# Patient Record
Sex: Male | Born: 1955 | Race: White | Hispanic: No | Marital: Single | Smoking: Current every day smoker
Health system: Southern US, Community
[De-identification: ages and names within clinical notes are randomized; demographics above are authoritative.]

## PROBLEM LIST (undated history)

## (undated) DIAGNOSIS — F1010 Alcohol abuse, uncomplicated: Secondary | ICD-10-CM

## (undated) DIAGNOSIS — K4090 Unilateral inguinal hernia, without obstruction or gangrene, not specified as recurrent: Secondary | ICD-10-CM

## (undated) HISTORY — PX: OTHER SURGICAL HISTORY: SHX169

## (undated) HISTORY — PX: ABDOMINAL SURGERY: SHX537

---

## 2008-10-02 ENCOUNTER — Emergency Department (HOSPITAL_COMMUNITY): Admission: EM | Admit: 2008-10-02 | Discharge: 2008-10-03 | Payer: Self-pay | Admitting: Emergency Medicine

## 2011-06-20 ENCOUNTER — Emergency Department (HOSPITAL_COMMUNITY): Payer: Self-pay

## 2011-06-20 ENCOUNTER — Inpatient Hospital Stay (HOSPITAL_COMMUNITY)
Admission: EM | Admit: 2011-06-20 | Discharge: 2011-06-23 | DRG: 917 | Discharge status: Against Medical Advice | Payer: Self-pay | Attending: Surgery | Admitting: Surgery

## 2011-06-20 DIAGNOSIS — T65891A Toxic effect of other specified substances, accidental (unintentional), initial encounter: Secondary | ICD-10-CM | POA: Diagnosis present

## 2011-06-20 DIAGNOSIS — F1410 Cocaine abuse, uncomplicated: Secondary | ICD-10-CM | POA: Diagnosis present

## 2011-06-20 DIAGNOSIS — Z5900 Homelessness unspecified: Secondary | ICD-10-CM

## 2011-06-20 DIAGNOSIS — F1010 Alcohol abuse, uncomplicated: Secondary | ICD-10-CM | POA: Diagnosis present

## 2011-06-20 DIAGNOSIS — T405X1A Poisoning by cocaine, accidental (unintentional), initial encounter: Secondary | ICD-10-CM | POA: Diagnosis present

## 2011-06-20 DIAGNOSIS — T510X4A Toxic effect of ethanol, undetermined, initial encounter: Principal | ICD-10-CM | POA: Diagnosis present

## 2011-06-20 DIAGNOSIS — J690 Pneumonitis due to inhalation of food and vomit: Secondary | ICD-10-CM | POA: Diagnosis present

## 2011-06-20 DIAGNOSIS — J9600 Acute respiratory failure, unspecified whether with hypoxia or hypercapnia: Secondary | ICD-10-CM | POA: Diagnosis present

## 2011-06-20 DIAGNOSIS — G9340 Encephalopathy, unspecified: Secondary | ICD-10-CM | POA: Diagnosis present

## 2011-06-20 DIAGNOSIS — F17200 Nicotine dependence, unspecified, uncomplicated: Secondary | ICD-10-CM | POA: Diagnosis present

## 2011-06-20 LAB — BLOOD GAS, ARTERIAL
Acid-base deficit: 2.9 mmol/L — ABNORMAL HIGH (ref 0.0–2.0)
FIO2: 1 %
MECHVT: 500 mL
TCO2: 20.6 mmol/L (ref 0–100)
pCO2 arterial: 46.2 mmHg — ABNORMAL HIGH (ref 35.0–45.0)

## 2011-06-20 LAB — DIFFERENTIAL
Basophils Absolute: 0 10*3/uL (ref 0.0–0.1)
Basophils Relative: 0 % (ref 0–1)
Eosinophils Absolute: 0.2 10*3/uL (ref 0.0–0.7)
Eosinophils Relative: 2 % (ref 0–5)

## 2011-06-20 LAB — URINALYSIS, ROUTINE W REFLEX MICROSCOPIC
Glucose, UA: NEGATIVE mg/dL
Ketones, ur: NEGATIVE mg/dL
Leukocytes, UA: NEGATIVE
Protein, ur: NEGATIVE mg/dL
Urobilinogen, UA: 0.2 mg/dL (ref 0.0–1.0)

## 2011-06-20 LAB — COMPREHENSIVE METABOLIC PANEL
AST: 28 U/L (ref 0–37)
Albumin: 3.8 g/dL (ref 3.5–5.2)
CO2: 24 mEq/L (ref 19–32)
Calcium: 8.5 mg/dL (ref 8.4–10.5)
Creatinine, Ser: 0.66 mg/dL (ref 0.50–1.35)
GFR calc non Af Amer: >60 mL/min (ref >60)

## 2011-06-20 LAB — CBC
Platelets: 307 10*3/uL (ref 150–400)
RDW: 12.6 % (ref 11.5–15.5)
WBC: 11.2 10*3/uL — ABNORMAL HIGH (ref 4.0–10.5)

## 2011-06-20 LAB — POCT I-STAT TROPONIN I: Troponin i, poc: 0 ng/mL (ref 0.00–0.08)

## 2011-06-20 LAB — AMMONIA: Ammonia: 76 umol/L — ABNORMAL HIGH (ref 11–60)

## 2011-06-20 LAB — POCT I-STAT, CHEM 8
BUN: 12 mg/dL (ref 6–23)
Calcium, Ion: 1.1 mmol/L — ABNORMAL LOW (ref 1.12–1.32)
Creatinine, Ser: 1.2 mg/dL (ref 0.50–1.35)
TCO2: 24 mmol/L (ref 0–100)

## 2011-06-20 LAB — RAPID URINE DRUG SCREEN, HOSP PERFORMED
Barbiturates: NOT DETECTED
Tetrahydrocannabinol: NOT DETECTED

## 2011-06-20 LAB — SALICYLATE LEVEL: Salicylate Lvl: <2 mg/dL — ABNORMAL LOW (ref 2.8–20.0)

## 2011-06-20 LAB — ACETAMINOPHEN LEVEL: Acetaminophen (Tylenol), Serum: <15 ug/mL (ref 10–30)

## 2011-06-21 ENCOUNTER — Inpatient Hospital Stay (HOSPITAL_COMMUNITY): Payer: Self-pay

## 2011-06-21 DIAGNOSIS — J9600 Acute respiratory failure, unspecified whether with hypoxia or hypercapnia: Secondary | ICD-10-CM

## 2011-06-21 DIAGNOSIS — J189 Pneumonia, unspecified organism: Secondary | ICD-10-CM

## 2011-06-21 DIAGNOSIS — F1410 Cocaine abuse, uncomplicated: Secondary | ICD-10-CM

## 2011-06-21 DIAGNOSIS — F1010 Alcohol abuse, uncomplicated: Secondary | ICD-10-CM

## 2011-06-21 DIAGNOSIS — R4182 Altered mental status, unspecified: Secondary | ICD-10-CM

## 2011-06-21 LAB — BLOOD GAS, ARTERIAL
Acid-base deficit: 4.4 mmol/L — ABNORMAL HIGH (ref 0.0–2.0)
Drawn by: 340271
FIO2: 0.5 %
MECHVT: 500 mL
RATE: 18 resp/min
TCO2: 20.1 mmol/L (ref 0–100)
pCO2 arterial: 48.7 mmHg — ABNORMAL HIGH (ref 35.0–45.0)
pH, Arterial: 7.281 — ABNORMAL LOW (ref 7.350–7.450)
pO2, Arterial: 206 mmHg — ABNORMAL HIGH (ref 80.0–100.0)

## 2011-06-21 LAB — CARDIAC PANEL(CRET KIN+CKTOT+MB+TROPI)
CK, MB: 4.1 ng/mL — ABNORMAL HIGH (ref 0.3–4.0)
Total CK: 153 U/L (ref 7–232)

## 2011-06-21 LAB — BASIC METABOLIC PANEL
BUN: 10 mg/dL (ref 6–23)
CO2: 22 mEq/L (ref 19–32)
Calcium: 7.9 mg/dL — ABNORMAL LOW (ref 8.4–10.5)
Chloride: 110 mEq/L (ref 96–112)
Creatinine, Ser: 0.78 mg/dL (ref 0.50–1.35)
Creatinine, Ser: 0.75 mg/dL (ref 0.50–1.35)
GFR calc Af Amer: >60 mL/min (ref >60)
GFR calc non Af Amer: >60 mL/min (ref >60)
Glucose, Bld: 82 mg/dL (ref 70–99)
Potassium: 3.4 mEq/L — ABNORMAL LOW (ref 3.5–5.1)
Sodium: 143 mEq/L (ref 135–145)

## 2011-06-21 LAB — MAGNESIUM: Magnesium: 2 mg/dL (ref 1.5–2.5)

## 2011-06-21 LAB — CBC
HCT: 41.1 % (ref 39.0–52.0)
Hemoglobin: 13.5 g/dL (ref 13.0–17.0)
MCH: 31.6 pg (ref 26.0–34.0)
MCHC: 32.8 g/dL (ref 30.0–36.0)
RDW: 13.1 % (ref 11.5–15.5)

## 2011-06-21 LAB — MRSA PCR SCREENING: MRSA by PCR: NEGATIVE

## 2011-06-21 LAB — LACTIC ACID, PLASMA: Lactic Acid, Venous: 2.2 mmol/L (ref 0.5–2.2)

## 2011-06-21 LAB — CK: Total CK: 145 U/L (ref 7–232)

## 2011-06-21 LAB — PHOSPHORUS: Phosphorus: 3.5 mg/dL (ref 2.3–4.6)

## 2011-06-22 ENCOUNTER — Inpatient Hospital Stay (HOSPITAL_COMMUNITY): Payer: Self-pay

## 2011-06-22 DIAGNOSIS — J9600 Acute respiratory failure, unspecified whether with hypoxia or hypercapnia: Secondary | ICD-10-CM

## 2011-06-22 DIAGNOSIS — R4182 Altered mental status, unspecified: Secondary | ICD-10-CM

## 2011-06-22 LAB — CULTURE, RESPIRATORY W GRAM STAIN

## 2011-06-22 LAB — COMPREHENSIVE METABOLIC PANEL
ALT: 14 U/L (ref 0–53)
BUN: 14 mg/dL (ref 6–23)
CO2: 27 mEq/L (ref 19–32)
Calcium: 8.4 mg/dL (ref 8.4–10.5)
Creatinine, Ser: 0.59 mg/dL (ref 0.50–1.35)
GFR calc Af Amer: >60 mL/min (ref >60)
GFR calc non Af Amer: >60 mL/min (ref >60)
Glucose, Bld: 120 mg/dL — ABNORMAL HIGH (ref 70–99)
Sodium: 136 mEq/L (ref 135–145)

## 2011-06-22 LAB — CBC
HCT: 35.7 % — ABNORMAL LOW (ref 39.0–52.0)
Hemoglobin: 12.3 g/dL — ABNORMAL LOW (ref 13.0–17.0)
MCH: 32.5 pg (ref 26.0–34.0)
MCHC: 34.5 g/dL (ref 30.0–36.0)
MCV: 94.4 fL (ref 78.0–100.0)
RBC: 3.78 MIL/uL — ABNORMAL LOW (ref 4.22–5.81)

## 2011-06-22 LAB — MAGNESIUM: Magnesium: 1.7 mg/dL (ref 1.5–2.5)

## 2011-06-23 ENCOUNTER — Encounter (HOSPITAL_COMMUNITY): Payer: Self-pay | Admitting: Radiology

## 2011-06-23 ENCOUNTER — Inpatient Hospital Stay (HOSPITAL_COMMUNITY): Payer: Self-pay

## 2011-06-26 NOTE — Discharge Summary (Signed)
  [name], [name] NO.:  [id number]  MEDICAL RECORD NO.:  [id number]  LOCATION:  1227                         FACILITY:  [facility]  PHYSICIAN:  Charlaine Dalton. Sherene Sires, MD, FCCPDATE OF BIRTH:  03/23/1956  DATE OF ADMISSION:  06/20/2011 DATE OF DISCHARGE:  06/23/2011                              DISCHARGE SUMMARY   DISCHARGE DIAGNOSES: 1. 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 2. Polysubstance abuse. 3. Questionable aspiration pneumonia.  HISTORY OF PRESENT ILLNESS:  [name] is a 55 year old male who was taken to the emergency room by EMS service, found to have a positive UDS for cocaine and alcohol level was elevated, required urgent orotracheal intubation and admission to the ICU.  LABORATORY DATA:  Hemoglobin 12.3, hematocrit 35.7, platelets 251, WBC 11.5.  Sodium 136, potassium 3.5, chloride 103, CO2 is 27, BUN 14, creatinine 0.59, glucose is 120.  AST is 16, ALT is 14, alkaline phosphatase is 78, total bilirubin 0.6, albumin is 2.6.  Troponin-I is less than 0.30, CK-MB is 3.6, CK is 133.  Calcium is 8.4, magnesium 1.7, phosphorus is 2.8.  Sputum culture shows rare gram-positive cocci in pairs.  Blood cultures are pending.  Lactic acid 2.2.  MRSA by swab is negative.  Ammonia level is noted to be 76.  Acetaminophen level is less than 15.  Salicylate level is less than 2.  Alcohol level was 461. Drugs of abuse was positive for cocaine.  RADIOGRAPHIC DATA:  CT of the head without contrast demonstrates no acute intracranial abnormalities, soft tissue swelling of the left scalp in the region of temporalis musculature, questionable if there has been any trauma, chronic sinusitis, questionable prominent fluid, and turbinate thickening within the nasal passages.  Chest x-ray of August 13th demonstrates shallow inspiration, bilateral pleural effusion, bibasilar atelectasis, removal of endotracheal tube.  HOSPITAL COURSE BY  DISCHARGE DIAGNOSES: 1. Vent-dependant respiratory failure secondary to polysubstance abuse with     cocaine and alcohol.  He has required orotracheal intubation from     August 10th to August 11th.  He was successfully liberated from     mechanical ventilatory support on August 11th.  He signed himself     out AMA on June 23, 2011, without interest in followups or any     medications. 2. Encephalopathic.  CT scan, as noted.  He was fully alert and     ambulatory at the time of his leaving. 3. Questionable pneumonia.  No acute evidence of pneumonia at this     time.  MEDICATIONS:  Given no medications since he signed out AMA at this time.  He has no followup appointments.  He will resume his regular diet.  There is no followup since again he signed out AMA.     Devra Dopp, MSN, ACNP   ______________________________ Charlaine Dalton. Sherene Sires, MD, FCCP    SM/MEDQ  D:  06/23/2011  T:  06/23/2011  Job:  [id number]  Electronically Signed by Devra Dopp MSN ACNP on 06/24/2011 03:28:31 PM Electronically Signed by Sandrea Hughs MD FCCP on 06/26/2011 09:11:30 PM

## 2011-06-27 LAB — CULTURE, BLOOD (ROUTINE X 2)
Culture  Setup Time: 201208110536
Culture: NO GROWTH

## 2011-07-09 ENCOUNTER — Emergency Department (HOSPITAL_COMMUNITY)
Admission: EM | Admit: 2011-07-09 | Discharge: 2011-07-09 | Discharge status: Against Medical Advice | Payer: Self-pay | Attending: Emergency Medicine | Admitting: Emergency Medicine

## 2011-07-09 DIAGNOSIS — F1010 Alcohol abuse, uncomplicated: Secondary | ICD-10-CM | POA: Insufficient documentation

## 2011-07-09 LAB — POCT I-STAT, CHEM 8
Calcium, Ion: 1.1 mmol/L — ABNORMAL LOW (ref 1.12–1.32)
Glucose, Bld: 89 mg/dL (ref 70–99)
HCT: 46 % (ref 39.0–52.0)
Hemoglobin: 15.6 g/dL (ref 13.0–17.0)
Potassium: 4 mEq/L (ref 3.5–5.1)

## 2011-07-26 NOTE — H&P (Signed)
Russell [name], Russell [name] NO.:  [id number]  MEDICAL RECORD NO.:  [id number]  LOCATION:  1227                         FACILITY:  [facility]  PHYSICIAN:  Orbie Hurst, MD         DATE OF BIRTH:  1956/09/25  DATE OF ADMISSION:  06/20/2011 DATE OF DISCHARGE:                             HISTORY & PHYSICAL   REFERRING PHYSICIAN:  Glynn Octave, MD  REASON FOR CONSULTATION:  Acute respiratory failure, alcohol abuse and cocaine abuse.  HISTORY OF PRESENT ILLNESS:  The patient is a 55 year old homeless male with a past medical history of alcohol abuse and polysubstance abuse including cocaine who was found outside on a bench unresponsive and a bystander called [phone].  The patient was found to be lethargic verbally unresponsive with a homeless tag.  The patient was brought to [facility].  He was intubated and he was found to have no gag reflex, unresponsive, and was intubated for airway protection. During intubation, he was found to have abundant respiratory secretions.  His O2 saturation was 99% before intubation and blood pressure 110/70.  Initial ABG showed 7.31, pCO2 46, pO2 448, bicarb 23, on PRBC 16, tidal volume 500, FiO2 100, and PEEP of 5.  Alcohol level was 461 and urine tox screen was positive for cocaine.  Ammonia level was 76.  Chest x-ray showed bibasilar infiltrates with adequate position of the ET tube.  He also has a CT scan of the head which showed no acute intracranial abnormalities with soft tissue swelling on the left scalp and evidence of chronic sinusitis. The patient also had history of a recent ER admission in November 2009 due to alcohol abuse.  Pulmonary critical care consultation was requested for further evaluation and management.  REVIEW OF SYSTEMS:  All other systems were negative except as above in HPI.  PAST MEDICAL HISTORY: 1. Alcohol abuse. 2. Substance abuse.  ALLERGIES:  No known drug allergies.  SOCIAL HISTORY:  The  patient is homeless.  He is a heavy drinker and he currently smokes.  Again, he is positive for cocaine in the urine drug screen.  FAMILY HISTORY:  Unknown.  PHYSICAL EXAM:  GENERAL:  The patient is sedated, intubated on mechanical ventilation.  VITAL SIGNS:  Blood pressure 134/84, heart rate 77, respiratory rate 16, temperature 37, O2 saturation 100% on PRBC 16, tidal volume is 500, FiO2 100, PEEP of 5.  HEENT:  Oral mucosa is moist. JVD.  No cervical or supraclavicular lymphadenopathy.  ET tube in place with respiratory secretions.  CARDIOVASCULAR:  Regular rhythm.  S1 and S2 normal.  LUNGS:  Mildly decreased breath sounds at the pulmonary bases with no crackles, rhonchi, or wheezes.  ABDOMEN:  Soft, nontender, nondistended.  Bowel sounds active.  No peritoneal signs.  EXTREMITIES: No lower extremity edema.  No calf tenderness.  NEUROLOGIC:  The patient is under sedation.  LABORATORY AND IMAGING:  CMP.  Sodium 141, potassium 3.5, chloride 107, bicarb 24, glucose 105, BUN 11, creatinine 0.66, alkaline phosphatase 96, AST 28, ALT 26, total protein 8.2, albumin 3.8, calcium 8.5.  CBC; WBC 11.2, hemoglobin 15.2, hematocrit 44.5, platelet count 307, and urine drug screen positive for cocaine.  Alcohol  level 461, troponin level 0.00.  Lactic acid 2.0.  Ammonia level 76.  Acetaminophen level less than 15, lipase 30.  Salicylate less than 2.  ABG as reported above in HPI.  Chest x-ray as reported above in HPI with bibasilar infiltrates, especially in the right lower lobe with ET tube in good position.  CT scan of the head as reported in the HPI with no acute intracranial abnormality and signs of chronic sinusitis with a soft tissue swelling on the left scalp.  ASSESSMENT AND PLAN:  The patient is a 55 year old homeless male with past medical history of polysubstance abuse including alcohol abuse and cocaine abuse, was found unresponsive on the street and was brought to [facility] with no gag reflex and very lethargic, and was intubated for airway protection. 1. Acute respiratory failure.  Likely due to alcohol intoxication and     cocaine abuse.  He also has chest x-ray with possible lower lobe     infiltrate concerning for aspiration pneumonitis.  The patient was     intubated and started on mechanical ventilation.  Initial ABGs     showed pH 7.3, pCO2 46, pO2 448, and bicarb 23.  We will change the     vent settings to PRBC with a RR of 18, tidal volume 500, FiO2     50%, and PEEP of 5.  We will repeat ABGs in 1 hour and we will     repeat chest x-rays.  If the chest x-ray shows worsening of     pulmonary infiltrates, we will consider this patient to be started     on the ARDS protocol for lung protective ventilation.  In the ER     the patient was started on Zosyn and vancomycin and we     will get respiratory cultures.  We will probably change the     antibiotic coverage depending on the respiratory cultures.  Blood     cultures and urine cultures were sent as well.  The patient is     hemodynamically stable at this time and he will continue on the ICU     sedation protocol with propofol and fentanyl.  We will get a CPK     and we will follow chemistries. 2. History of alcohol abuse.  He came with alcohol level of 461,     acutely intoxicated and with increased ammonia level.  The patient     is now intubated and on mechanical ventilation.  We will continue     sedation with propofol and fentanyl.  We will be watching for signs     of alcohol withdrawal and especially in 48 to 72 hours for DTs.     Once the patient is extubated, he will be started on CIWA protocol.     We will replace electrolytes as needed and he will get IV fluids     hydration. 3. History of cocaine abuse.  He came with positive cocaine and a     urine tox screen.  His EKG shows normal sinus rhythm with normal P-     waves and normal QRS and T-waves.  He will continue with  sedation     with propofol and fentanyl, and we will be monitoring him for     possible arrhythmias.  He is hemodynamically stable at this time     and his troponins are negative. 4. Altered mental status.  Likely due to alcohol intoxication and  cocaine abuse.  The patient was intubated and started on mechanical     ventilation.  He will continue on ICU sedation protocol with     propofol and fentanyl, and we will be careful about watching him     for signs of alcohol withdrawal and DTs. 5. DVT and GI prophylaxis.  We start SCDs, heparin q.8 h. and Protonix     IV. 6. Fluids, electrolytes, and nutrition.  We will continue on IV fluids     hydration with normal saline.  We will replace electrolytes as     needed and n.p.o. for now.  Total critical care time 60 minutes.     Orbie Hurst, MD     JR/MEDQ  D:  06/21/2011  T:  06/21/2011  Job:  [id number]  Electronically Signed by Orbie Hurst M.D. on 07/26/2011 81:19:14 PM

## 2011-08-01 ENCOUNTER — Emergency Department (HOSPITAL_COMMUNITY)
Admission: EM | Admit: 2011-08-01 | Discharge: 2011-08-01 | Disposition: A | Discharge status: Home / Self Care | Payer: Self-pay | Attending: Emergency Medicine | Admitting: Emergency Medicine

## 2011-08-01 ENCOUNTER — Emergency Department (HOSPITAL_COMMUNITY): Payer: Self-pay

## 2011-08-01 DIAGNOSIS — J069 Acute upper respiratory infection, unspecified: Secondary | ICD-10-CM | POA: Insufficient documentation

## 2011-08-01 DIAGNOSIS — R059 Cough, unspecified: Secondary | ICD-10-CM | POA: Insufficient documentation

## 2011-08-01 DIAGNOSIS — R05 Cough: Secondary | ICD-10-CM | POA: Insufficient documentation

## 2011-08-12 LAB — COMPREHENSIVE METABOLIC PANEL
BUN: 9
Calcium: 8.7
Creatinine, Ser: 0.89
Glucose, Bld: 109 — ABNORMAL HIGH
Total Protein: 7.4

## 2011-08-12 LAB — CBC
HCT: 42.8
Hemoglobin: 14.9
MCHC: 34.8
RDW: 12.4

## 2011-08-12 LAB — DIFFERENTIAL
Basophils Relative: 1
Lymphs Abs: 3.6
Monocytes Relative: 11
Neutro Abs: 3.7
Neutrophils Relative %: 44

## 2011-09-02 ENCOUNTER — Emergency Department (HOSPITAL_COMMUNITY)
Admission: EM | Admit: 2011-09-02 | Discharge: 2011-09-02 | Payer: Self-pay | Attending: Emergency Medicine | Admitting: Emergency Medicine

## 2011-09-02 DIAGNOSIS — F1010 Alcohol abuse, uncomplicated: Secondary | ICD-10-CM | POA: Insufficient documentation

## 2011-09-02 DIAGNOSIS — IMO0002 Reserved for concepts with insufficient information to code with codable children: Secondary | ICD-10-CM | POA: Insufficient documentation

## 2011-09-02 DIAGNOSIS — Z0389 Encounter for observation for other suspected diseases and conditions ruled out: Secondary | ICD-10-CM | POA: Insufficient documentation

## 2011-09-02 DIAGNOSIS — R000 Tachycardia, unspecified: Secondary | ICD-10-CM | POA: Insufficient documentation

## 2012-04-09 ENCOUNTER — Encounter (HOSPITAL_COMMUNITY): Payer: Self-pay | Admitting: Emergency Medicine

## 2012-04-09 ENCOUNTER — Emergency Department (HOSPITAL_COMMUNITY)
Admission: EM | Admit: 2012-04-09 | Discharge: 2012-04-09 | Disposition: A | Discharge status: Home / Self Care | Payer: Self-pay | Attending: Emergency Medicine | Admitting: Emergency Medicine

## 2012-04-09 DIAGNOSIS — L723 Sebaceous cyst: Secondary | ICD-10-CM | POA: Insufficient documentation

## 2012-04-09 DIAGNOSIS — R209 Unspecified disturbances of skin sensation: Secondary | ICD-10-CM | POA: Insufficient documentation

## 2012-04-09 DIAGNOSIS — R29898 Other symptoms and signs involving the musculoskeletal system: Secondary | ICD-10-CM | POA: Insufficient documentation

## 2012-04-09 DIAGNOSIS — R202 Paresthesia of skin: Secondary | ICD-10-CM

## 2012-04-09 DIAGNOSIS — M25362 Other instability, left knee: Secondary | ICD-10-CM

## 2012-04-09 DIAGNOSIS — T792XXA Traumatic secondary and recurrent hemorrhage and seroma, initial encounter: Secondary | ICD-10-CM | POA: Insufficient documentation

## 2012-04-09 MED ORDER — IBUPROFEN 600 MG PO TABS: 600.0000 mg | ORAL_TABLET | Freq: Four times a day (QID) | ORAL | Status: AC | PRN

## 2012-04-09 MED ORDER — IBUPROFEN 800 MG PO TABS
800.0000 mg | ORAL_TABLET | Freq: Once | ORAL | Status: AC
  Administered 2012-04-09: 800 mg via ORAL
  Filled 2012-04-09: qty 1

## 2012-04-09 NOTE — ED Notes (Signed)
Pt presented to the Er with c/o abscess to the back and knee pain, states that has Hx of the same. Pt also reports left knee pain, states "it feels like it will give out"

## 2012-04-09 NOTE — ED Provider Notes (Signed)
Medical screening examination/treatment/procedure(s) were performed by non-physician practitioner and as supervising physician I was immediately available for consultation/collaboration.   Malayia Spizzirri M Calder Oblinger, MD 04/09/12 2245 

## 2012-04-09 NOTE — Discharge Instructions (Signed)
Your sebaceous cyst, was removed, you have to stitches that will need to be removed in 10 days.  Please have your partner, change the Band-Aid on a daily basis, and to check for signs of infection

## 2012-04-09 NOTE — ED Notes (Signed)
Pt also has bump on back that he wants checked out.

## 2012-04-09 NOTE — ED Provider Notes (Signed)
History     CSN: [id number]  Arrival date & time 04/09/12  0146   First MD Initiated Contact with Patient 04/09/12 [phone]      Chief Complaint  Patient presents with  . Abscess  . Knee Pain    (Consider location/radiation/quality/duration/timing/severity/associated sxs/prior treatment) HPI Comments: States he has chronic left knee.  Pain and feels, like it "gives out" on him occasionally.  He also is complaining of an area of paresthesia on the lateral left thigh.  That is intermittent.  It is minimally painful.  He also is requesting a check of the large sebaceous cyst that is on his back.  Theatcausing him discomfort when he tries to sleep  Patient is a 56 y.o. male presenting with abscess and knee pain. The history is provided by the patient.  Abscess  This is a recurrent problem. The problem occurs continuously. The problem is mild. The abscess is characterized by painfulness. Pertinent negatives include no fever.  Knee Pain Pertinent negatives include no fever, headaches, joint swelling or weakness.    History reviewed. No pertinent past medical history.  Past Surgical History  Procedure Date  . Abdominal surgery   . Liver repair     History reviewed. No pertinent family history.  History  Substance Use Topics  . Smoking status: Current Everyday Smoker    Types: Cigarettes  . Smokeless tobacco: Not on file  . Alcohol Use: Yes      Review of Systems  Constitutional: Negative for fever.  Cardiovascular: Negative for leg swelling.  Musculoskeletal: Negative for joint swelling.  Neurological: Negative for dizziness, weakness and headaches.    Allergies  Review of patient's allergies indicates no known allergies.  Home Medications   Current Outpatient Rx  Name Route Sig Dispense Refill  . IBUPROFEN 600 MG PO TABS Oral Take 1 tablet (600 mg total) by mouth every 6 (six) hours as needed for pain. 30 tablet 0    BP 141/67  Pulse 75  Temp(Src) 97.9 F (36.6  C) (Oral)  Resp 18  SpO2 99%  Physical Exam  Constitutional: He appears well-developed and well-nourished.  HENT:  Head: Normocephalic.  Eyes: Pupils are equal, round, and reactive to light.  Neck: Normal range of motion.  Cardiovascular: Normal rate.   Musculoskeletal: Normal range of motion. He exhibits no edema and no tenderness.       Legs: Skin:       ED Course  INCISION AND DRAINAGE Date/Time: 04/09/2012 4:29 AM Performed by: Arman Filter Authorized by: Arman Filter Consent: Verbal consent obtained. Written consent not obtained. Consent given by: patient Patient understanding: patient states understanding of the procedure being performed Patient identity confirmed: verbally with patient Time out: Immediately prior to procedure a "time out" was called to verify the correct patient, procedure, equipment, support staff and site/side marked as required. Type: seroma Body area: trunk Location details: back Anesthesia: local infiltration Local anesthetic: lidocaine 1% with epinephrine Anesthetic total: 4 ml Patient sedated: no Scalpel size: 11 Needle gauge: 22 Incision type: single straight Complexity: simple Drainage amount: copious Patient tolerance: Patient tolerated the procedure well with no immediate complications. Comments: 2 sutures placed after cyst sac removed   (including critical care time)  Labs Reviewed - No data to display No results found.   1. Knee buckling, left   2. Sebaceous cyst   3. Paresthesia of left leg       MDM   Sebaceous cyst, was removed  Arman Filter, NP 04/09/12 0435  Arman Filter, NP 04/09/12 [phone]

## 2013-01-31 ENCOUNTER — Emergency Department (HOSPITAL_COMMUNITY): Admission: EM | Admit: 2013-01-31 | Payer: Self-pay | Source: Home / Self Care

## 2013-01-31 ENCOUNTER — Emergency Department (HOSPITAL_COMMUNITY)
Admission: EM | Admit: 2013-01-31 | Discharge: 2013-01-31 | Disposition: A | Discharge status: Home / Self Care | Payer: Self-pay | Attending: Emergency Medicine | Admitting: Emergency Medicine

## 2013-01-31 ENCOUNTER — Encounter (HOSPITAL_COMMUNITY): Payer: Self-pay | Admitting: *Deleted

## 2013-01-31 DIAGNOSIS — K0889 Other specified disorders of teeth and supporting structures: Secondary | ICD-10-CM

## 2013-01-31 DIAGNOSIS — F17200 Nicotine dependence, unspecified, uncomplicated: Secondary | ICD-10-CM | POA: Insufficient documentation

## 2013-01-31 DIAGNOSIS — K089 Disorder of teeth and supporting structures, unspecified: Secondary | ICD-10-CM | POA: Insufficient documentation

## 2013-01-31 MED ORDER — OXYCODONE-ACETAMINOPHEN 5-325 MG PO TABS
2.0000 | ORAL_TABLET | Freq: Once | ORAL | Status: AC
  Administered 2013-01-31: 2 via ORAL
  Filled 2013-01-31: qty 2

## 2013-01-31 MED ORDER — OXYCODONE-ACETAMINOPHEN 5-325 MG PO TABS: 1.0000 | ORAL_TABLET | Freq: Four times a day (QID) | ORAL | Status: DC | PRN

## 2013-01-31 MED ORDER — PENICILLIN V POTASSIUM 500 MG PO TABS: 500.0000 mg | ORAL_TABLET | Freq: Four times a day (QID) | ORAL | Status: DC

## 2013-01-31 MED ORDER — BUPIVACAINE-EPINEPHRINE PF 0.5-1:200000 % IJ SOLN
1.8000 mL | Freq: Once | INTRAMUSCULAR | Status: DC
  Filled 2013-01-31: qty 1.8

## 2013-01-31 NOTE — ED Provider Notes (Signed)
Medical screening examination/treatment/procedure(s) were performed by non-physician practitioner and as supervising physician I was immediately available for consultation/collaboration.   Lyanne Co, MD 01/31/13 [phone]

## 2013-01-31 NOTE — ED Notes (Signed)
Pt reports dental pain.

## 2013-01-31 NOTE — ED Provider Notes (Signed)
History     CSN: [id number]  Arrival date & time 01/31/13  1101   First MD Initiated Contact with Patient 01/31/13 1101      Chief Complaint  Patient presents with  . Dental Pain    (Consider location/radiation/quality/duration/timing/severity/associated sxs/prior treatment) HPI Comments: This is a 57 year old male, who presents emergency department with chief complaint of dental pain. Patient states that he has had a loose front tooth for the past several months. The pain is recently worsened. He denies fever, chills, nausea, and vomiting. States that the pain is 10 out of 10. Nothing makes it better. Chewing makes his pain worse. The pain does not radiate. He has not seen a dentist.  The history is provided by the patient. No language interpreter was used.    History reviewed. No pertinent past medical history.  Past Surgical History  Procedure Laterality Date  . Abdominal surgery    . Liver repair      No family history on file.  History  Substance Use Topics  . Smoking status: Current Every Day Smoker    Types: Cigarettes  . Smokeless tobacco: Not on file  . Alcohol Use: Yes      Review of Systems  All other systems reviewed and are negative.    Allergies  Review of patient's allergies indicates no known allergies.  Home Medications  No current outpatient prescriptions on file.  BP 148/78  Pulse 87  Temp(Src) 97.9 F (36.6 C) (Oral)  Resp 16  SpO2 98%  Physical Exam  Nursing note and vitals reviewed. Constitutional: He is oriented to person, place, and time. He appears well-developed and well-nourished.  HENT:  Head: Normocephalic and atraumatic.  Mouth/Throat:    Poor dentition throughout, no signs of gingival or peritonsillar or tonsillar abscesses, uvula is midline, airway is intact, front lower left canine is loose  Eyes: Conjunctivae and EOM are normal.  Neck: Normal range of motion.  Cardiovascular: Normal rate.   Pulmonary/Chest: Effort  normal.  Abdominal: He exhibits no distension.  Musculoskeletal: Normal range of motion.  Neurological: He is alert and oriented to person, place, and time.  Skin: Skin is dry.  Psychiatric: He has a normal mood and affect. His behavior is normal. Judgment and thought content normal.    ED Course  Dental Date/Time: 01/31/2013 2:21 PM Performed by: Roxy Horseman Authorized by: Roxy Horseman Consent: Verbal consent obtained. Risks and benefits: risks, benefits and alternatives were discussed Consent given by: patient Patient understanding: patient states understanding of the procedure being performed Required items: required blood products, implants, devices, and special equipment available Patient identity confirmed: verbally with patient Local anesthesia used: yes Local anesthetic: bupivacaine 0.5% with epinephrine Patient sedated: no Patient tolerance: Patient tolerated the procedure well with no immediate complications.   (including critical care time)  Labs Reviewed - No data to display No results found.   1. Pain, dental       MDM  57 year old male with uncomplicated dental pain. Pain managed with dental block, and Percocet in the ED. Will discharge with penicillin and Percocet. Followup with dentist. Patient understands and agrees with the plan.        Roxy Horseman, PA-C 01/31/13 1424

## 2013-03-06 ENCOUNTER — Emergency Department (HOSPITAL_COMMUNITY): Payer: Self-pay

## 2013-03-06 ENCOUNTER — Emergency Department (HOSPITAL_COMMUNITY)
Admission: EM | Admit: 2013-03-06 | Discharge: 2013-03-06 | Disposition: A | Discharge status: Home / Self Care | Payer: Self-pay | Attending: Emergency Medicine | Admitting: Emergency Medicine

## 2013-03-06 DIAGNOSIS — Y9289 Other specified places as the place of occurrence of the external cause: Secondary | ICD-10-CM | POA: Insufficient documentation

## 2013-03-06 DIAGNOSIS — S8990XA Unspecified injury of unspecified lower leg, initial encounter: Secondary | ICD-10-CM | POA: Insufficient documentation

## 2013-03-06 DIAGNOSIS — W010XXA Fall on same level from slipping, tripping and stumbling without subsequent striking against object, initial encounter: Secondary | ICD-10-CM | POA: Insufficient documentation

## 2013-03-06 DIAGNOSIS — Y9389 Activity, other specified: Secondary | ICD-10-CM | POA: Insufficient documentation

## 2013-03-06 DIAGNOSIS — M25572 Pain in left ankle and joints of left foot: Secondary | ICD-10-CM

## 2013-03-06 DIAGNOSIS — F17200 Nicotine dependence, unspecified, uncomplicated: Secondary | ICD-10-CM | POA: Insufficient documentation

## 2013-03-06 MED ORDER — HYDROCODONE-ACETAMINOPHEN 5-325 MG PO TABS: 1.0000 | ORAL_TABLET | Freq: Four times a day (QID) | ORAL | Status: DC | PRN

## 2013-03-06 MED ORDER — HYDROCODONE-ACETAMINOPHEN 5-325 MG PO TABS
1.0000 | ORAL_TABLET | Freq: Once | ORAL | Status: AC
  Administered 2013-03-06: 1 via ORAL
  Filled 2013-03-06: qty 1

## 2013-03-06 NOTE — ED Notes (Signed)
Pt states he slipped on some pine needles and injured his L ankle a few weeks ago. Pt states the pain got much worse last night and now there is now swelling to ankle. Pt ambulatory to exam room with limp.

## 2013-03-06 NOTE — ED Notes (Signed)
Ortho tech paged for application of ASO splint and crutches.

## 2013-03-06 NOTE — ED Provider Notes (Signed)
History    This chart was scribed for Russell Russell [name], non-physician practitioner working with Russell Chick, MD by Russell Russell [name], ED Scribe. This patient was seen in room WTR9/WTR9 and the patient's care was started at 1603.   CSN: [id number]  Arrival date & time 03/06/13  1603   First MD Initiated Contact with Patient 03/06/13 1606      Chief Complaint  Patient presents with  . Ankle Pain     Patient is a 57 y.o. Russell [name] presenting with ankle pain. The history is provided by the patient. No language interpreter was used.  Ankle Pain Location:  Ankle Time since incident:  2 weeks Injury: yes   Mechanism of injury: fall   Fall:    Fall occurred:  Walking and tripped   [employer] of impact:  Feet   Entrapped after fall: no   Ankle location:  L ankle  Russell Russell [name] is a 57 y.o. Russell [name] who presents to the Emergency Department complaining of ongoing, constant L ankle pain that started after a slip and fall about 2 weeks ago. Pt states the pain has worsened since last night and now there is swelling to the injured ankle. Pt denies taking any OTC pain medication. Russell Russell [name] denies fever, chills, numbness, tingling, calf pain. Russell Russell [name] denies h/o gout, blood clot.  Russell Russell [name] denies any redness of the ankle.   Pt is a current everyday smoker and occasional alcohol user.   No past medical history on file.  Past Surgical History  Procedure Laterality Date  . Abdominal surgery    . Liver repair      No family history on file.  History  Substance Use Topics  . Smoking status: Current Every Day Smoker    Types: Cigarettes  . Smokeless tobacco: Not on file  . Alcohol Use: Yes      Review of Systems A complete 10 system review of systems was obtained and all systems are negative except as noted in the HPI and PMH.   Allergies  Review of patient's allergies indicates no known allergies.  Home Medications   Current Outpatient Rx  Name  Route  Sig  Dispense  Refill  . oxyCODONE-acetaminophen  (PERCOCET/ROXICET) 5-325 MG per tablet   Oral   Take 1 tablet by mouth every 6 (six) hours as needed for pain.   7 tablet   0   . penicillin v potassium (VEETID) 500 MG tablet   Oral   Take 1 tablet (500 mg total) by mouth 4 (four) times daily.   40 tablet   0     BP 161/72  Pulse 95  Temp(Src) 98 F (36.7 C) (Oral)  Resp 16  SpO2 96%  Physical Exam  Nursing note and vitals reviewed. Constitutional: Russell Russell [name] is oriented to person, place, and time. Russell Russell [name] appears well-developed and well-nourished. No distress.  HENT:  Head: Normocephalic and atraumatic.  Eyes: EOM are normal.  Neck: Neck supple. No tracheal deviation present.  Cardiovascular: Normal rate, regular rhythm and normal heart sounds.  Exam reveals no gallop and no friction rub.   No murmur heard. Good dorsal pedal pulse.   Pulmonary/Chest: Effort normal and breath sounds normal. No respiratory distress. Russell Russell [name] has no wheezes. Russell Russell [name] has no rales. Russell Russell [name] exhibits no tenderness.  Musculoskeletal: Normal range of motion.       Left ankle: Russell Russell [name] exhibits swelling. Russell Russell [name] exhibits normal range of motion, no laceration and normal pulse. Tenderness. Lateral malleolus tenderness found.  Good passive ROM, with increased pain.  Neurological: Russell Russell [name] is alert and oriented to person, place, and time. No sensory deficit.  Distal sensation is intact.   Skin: Skin is warm and dry. No erythema.  No warmth or warmth of the left ankle.  Psychiatric: Russell Russell [name] has a normal mood and affect. His behavior is normal.    ED Course  Procedures (including critical care time)  DIAGNOSTIC STUDIES: Oxygen Saturation is 96% on room air, adequate by my interpretation.    COORDINATION OF CARE: 5:16 PM-Discussed treatment plan with pt at bedside and pt agreed to plan.    Labs Reviewed - No data to display Dg Ankle Complete Left  03/06/2013  *RADIOLOGY REPORT*  Clinical Data: Left-sided ankle pain.  LEFT ANKLE COMPLETE - 3+ VIEW  Comparison: No priors.  Findings: Soft tissue  swelling overlying the lateral malleolus.  No definite acute displaced fracture, subluxation or dislocation.  IMPRESSION: 1.  Soft tissue swelling over the lateral malleolus without definite underlying acute bony trauma.   Original Report Authenticated By: Russell Russell [name], M.D.      No diagnosis found.    MDM  Patient presenting with pain and swelling of his left ankle after twisting it.  No signs of infection at this time.  Patient neurovascularly intact.  Patient given Ankle ASO and crutches.  Return precautions given.  I personally performed the services described in this documentation, which was scribed in my presence. The recorded information has been reviewed and is accurate.   Russell Lux Winters, PA-C 03/07/13 1607

## 2013-03-06 NOTE — ED Notes (Signed)
Pt has a ride home.  

## 2013-03-07 NOTE — ED Provider Notes (Signed)
Medical screening examination/treatment/procedure(s) were performed by non-physician practitioner and as supervising physician I was immediately available for consultation/collaboration.  Ethelda Chick, MD 03/07/13 [phone]

## 2013-05-07 ENCOUNTER — Emergency Department (HOSPITAL_COMMUNITY)
Admission: EM | Admit: 2013-05-07 | Discharge: 2013-05-07 | Disposition: A | Discharge status: Home / Self Care | Payer: Self-pay | Attending: Emergency Medicine | Admitting: Emergency Medicine

## 2013-05-07 ENCOUNTER — Encounter (HOSPITAL_COMMUNITY): Payer: Self-pay

## 2013-05-07 ENCOUNTER — Emergency Department (HOSPITAL_COMMUNITY): Payer: Self-pay

## 2013-05-07 DIAGNOSIS — M25512 Pain in left shoulder: Secondary | ICD-10-CM

## 2013-05-07 DIAGNOSIS — IMO0002 Reserved for concepts with insufficient information to code with codable children: Secondary | ICD-10-CM | POA: Insufficient documentation

## 2013-05-07 DIAGNOSIS — M792 Neuralgia and neuritis, unspecified: Secondary | ICD-10-CM

## 2013-05-07 DIAGNOSIS — F17200 Nicotine dependence, unspecified, uncomplicated: Secondary | ICD-10-CM | POA: Insufficient documentation

## 2013-05-07 DIAGNOSIS — M25519 Pain in unspecified shoulder: Secondary | ICD-10-CM | POA: Insufficient documentation

## 2013-05-07 DIAGNOSIS — M1990 Unspecified osteoarthritis, unspecified site: Secondary | ICD-10-CM | POA: Insufficient documentation

## 2013-05-07 LAB — COMPREHENSIVE METABOLIC PANEL
ALT: 26 U/L (ref 0–53)
Alkaline Phosphatase: 105 U/L (ref 39–117)
Chloride: 103 mEq/L (ref 96–112)
GFR calc Af Amer: >90 mL/min (ref >90)
Glucose, Bld: 99 mg/dL (ref 70–99)
Potassium: 3.7 mEq/L (ref 3.5–5.1)
Sodium: 139 mEq/L (ref 135–145)
Total Bilirubin: 0.4 mg/dL (ref 0.3–1.2)
Total Protein: 7.8 g/dL (ref 6.0–8.3)

## 2013-05-07 LAB — CBC
HCT: 40.5 % (ref 39.0–52.0)
Hemoglobin: 13.9 g/dL (ref 13.0–17.0)
MCHC: 34.3 g/dL (ref 30.0–36.0)
RBC: 4.23 MIL/uL (ref 4.22–5.81)
WBC: 7.9 10*3/uL (ref 4.0–10.5)

## 2013-05-07 MED ORDER — HYDROCODONE-ACETAMINOPHEN 5-325 MG PO TABS: 1.0000 | ORAL_TABLET | Freq: Four times a day (QID) | ORAL | Status: DC | PRN

## 2013-05-07 MED ORDER — PREDNISONE 20 MG PO TABS: ORAL_TABLET | ORAL | Status: DC

## 2013-05-07 MED ORDER — PREDNISONE 20 MG PO TABS
60.0000 mg | ORAL_TABLET | Freq: Once | ORAL | Status: AC
  Administered 2013-05-07: 60 mg via ORAL
  Filled 2013-05-07: qty 3

## 2013-05-07 NOTE — ED Provider Notes (Signed)
History    CSN: [id number] Arrival date & time 05/07/13  1326  First MD Initiated Contact with Patient 05/07/13 1401     Chief Complaint  Patient presents with  . Extremity Pain   (Consider location/radiation/quality/duration/timing/severity/associated sxs/prior Treatment) Patient is a 57 y.o. male presenting with extremity pain. The history is provided by the patient.  Extremity Pain Pertinent negatives include no chest pain, no abdominal pain, no headaches and no shortness of breath.  pt c/o pain left neck/shoulder and arm for the past day. Constant. Dull.  Moderate. No specific exacerbating or alleviating factors. Pt denies hx same. No relation to activity or exertion. Pt denies injury or fall, but does state he has been drinking heavily for 1-2 weeks.  Denies falling asleep in unusual position or with arm/shoulder in awkward position/over bench, etc.  No hx ddd. Denies hx shoulder injury or rotator cuff injury. No numbness/weakness. No loss of use of function. No associated cp or discomfort. No sob. No arm swelling. No skin changes, redness or rash/lesions.  History reviewed. No pertinent past medical history. Past Surgical History  Procedure Laterality Date  . Abdominal surgery    . Liver repair     History reviewed. No pertinent family history. History  Substance Use Topics  . Smoking status: Current Every Day Smoker    Types: Cigarettes  . Smokeless tobacco: Not on file  . Alcohol Use: Yes    Review of Systems  Constitutional: Negative for fever and chills.  HENT: Negative for neck stiffness.   Eyes: Negative for visual disturbance.  Respiratory: Negative for shortness of breath.   Cardiovascular: Negative for chest pain.  Gastrointestinal: Negative for nausea, vomiting and abdominal pain.  Genitourinary: Negative for flank pain.  Musculoskeletal: Negative for joint swelling and gait problem.  Skin: Negative for rash.  Neurological: Negative for speech difficulty,  weakness, numbness and headaches.  Hematological: Does not bruise/bleed easily.  Psychiatric/Behavioral: Negative for confusion.    Allergies  Review of patient's allergies indicates no known allergies.  Home Medications  No current outpatient prescriptions on file. BP 156/75  Pulse 90  Temp(Src) 98.3 F (36.8 C) (Oral)  Resp 20  SpO2 98% Physical Exam  Nursing note and vitals reviewed. Constitutional: He is oriented to person, place, and time. He appears well-developed and well-nourished. No distress.  HENT:  Head: Atraumatic.  Mouth/Throat: Oropharynx is clear and moist.  Eyes: Pupils are equal, round, and reactive to light. No scleral icterus.  Neck: Neck supple. No tracheal deviation present.  Cardiovascular: Normal rate, regular rhythm, normal heart sounds and intact distal pulses.   Pulmonary/Chest: Effort normal and breath sounds normal. No accessory muscle usage. No respiratory distress. He exhibits no tenderness.  Abdominal: Soft. Bowel sounds are normal. He exhibits no distension. There is no tenderness.  Musculoskeletal: Normal range of motion. He exhibits no edema and no tenderness.  ?mild pain w rom left shoulder. Tenderness left neck/trapezius and shoulder. Radial pulse 2+ bil. Good rom at left shoulder, elbow and wrist. No swelling. ctls spine non tender.  Neurological: He is alert and oriented to person, place, and time. No cranial nerve deficit.  Motor intact bil, 5/5. Left r/m/u nerve fxn intact. No pronator drift. Steady gait.   Skin: Skin is warm and dry. No rash noted. He is not diaphoretic.    ED Course  Procedures (including critical care time)  Results for orders placed during the hospital encounter of 05/07/13  CBC      Result Value  Range   WBC 7.9  4.0 - 10.5 K/uL   RBC 4.23  4.22 - 5.81 MIL/uL   Hemoglobin 13.9  13.0 - 17.0 g/dL   HCT 16.1  09.6 - 04.5 %   MCV 95.7  78.0 - 100.0 fL   MCH 32.9  26.0 - 34.0 pg   MCHC 34.3  30.0 - 36.0 g/dL   RDW  40.9  81.1 - 91.4 %   Platelets 269  150 - 400 K/uL  COMPREHENSIVE METABOLIC PANEL      Result Value Range   Sodium 139  135 - 145 mEq/L   Potassium 3.7  3.5 - 5.1 mEq/L   Chloride 103  96 - 112 mEq/L   CO2 26  19 - 32 mEq/L   Glucose, Bld 99  70 - 99 mg/dL   BUN 10  6 - 23 mg/dL   Creatinine, Ser 7.82  0.50 - 1.35 mg/dL   Calcium 9.0  8.4 - 95.6 mg/dL   Total Protein 7.8  6.0 - 8.3 g/dL   Albumin 3.7  3.5 - 5.2 g/dL   AST 34  0 - 37 U/L   ALT 26  0 - 53 U/L   Alkaline Phosphatase 105  39 - 117 U/L   Total Bilirubin 0.4  0.3 - 1.2 mg/dL   GFR calc non Af Amer >90  >90 mL/min   GFR calc Af Amer >90  >90 mL/min  TROPONIN I      Result Value Range   Troponin I <0.30  <0.30 ng/mL  ETHANOL      Result Value Range   Alcohol, Ethyl (B) <11  0 - 11 mg/dL   Dg Cervical Spine Complete  05/07/2013   *RADIOLOGY REPORT*  Clinical Data: Left neck pain extending to the shoulder.  No injury.  CERVICAL SPINE - COMPLETE 4+ VIEW  Comparison: None.  Findings: No prevertebral soft tissue swelling or malalignment in the cervical spine identified.  Prominent anterior interbody spurring at C6-7 with lesser anterior interbody spurring at C5-6.  No cervical spine fracture identified.  IMPRESSION: 1.  Cervical spondylosis at C6-7 and to a lesser extent C5-6.  No cervical spine fracture or static instability observed.   Original Report Authenticated By: Gaylyn Rong, M.D.   Dg Shoulder Left  05/07/2013   *RADIOLOGY REPORT*  Clinical Data: Left shoulder and proximal humerus pain.  No injury.  LEFT SHOULDER - 2+ VIEW  Comparison: None.  Findings: There is no evidence of fractures or bone destruction. There is mild narrowing of the acromioclavicular joint.  There is moderate narrowing of the glenohumeral joint with slight spurring along the anterior edge of the glenoid.  Proximal left humerus is normal except for mild degenerative changes. The humerus is not elevated.  There are no abnormalities additionally in  the shoulder. The contiguous ribs are normal.  The left lung apex appears clear.  IMPRESSION: Degenerative changes especially in the glenohumeral joint.  No acute findings.   Original Report Authenticated By: Sander Radon, M.D.     MDM  Labs. Xr.  Reviewed nursing notes and prior charts for additional history.    Date: 05/07/2013  Rate: 83  Rhythm: normal sinus rhythm  QRS Axis: normal  Intervals: normal  ST/T Wave abnormalities: normal  Conduction Disutrbances:none  Narrative Interpretation:   Old EKG Reviewed: unchanged  ?radicular symptoms. Deg changes on xrays  c spine and shoulder.  Normal neuro exam.   Discussed labs/xr w pt.  pred po.  Will  rx pred, and norco, discussed close pcp follow up.  Pt appears stable for d/c.    Suzi Roots, MD 05/08/13 [phone]
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
## 2013-09-05 ENCOUNTER — Encounter (HOSPITAL_COMMUNITY): Payer: Self-pay | Admitting: Emergency Medicine

## 2013-09-05 ENCOUNTER — Emergency Department (HOSPITAL_COMMUNITY): Payer: Self-pay

## 2013-09-05 ENCOUNTER — Emergency Department (HOSPITAL_COMMUNITY)
Admission: EM | Admit: 2013-09-05 | Discharge: 2013-09-05 | Payer: Self-pay | Attending: Emergency Medicine | Admitting: Emergency Medicine

## 2013-09-05 DIAGNOSIS — IMO0002 Reserved for concepts with insufficient information to code with codable children: Secondary | ICD-10-CM | POA: Insufficient documentation

## 2013-09-05 DIAGNOSIS — S46909A Unspecified injury of unspecified muscle, fascia and tendon at shoulder and upper arm level, unspecified arm, initial encounter: Secondary | ICD-10-CM | POA: Insufficient documentation

## 2013-09-05 DIAGNOSIS — F17200 Nicotine dependence, unspecified, uncomplicated: Secondary | ICD-10-CM | POA: Insufficient documentation

## 2013-09-05 DIAGNOSIS — Z23 Encounter for immunization: Secondary | ICD-10-CM | POA: Insufficient documentation

## 2013-09-05 DIAGNOSIS — F1010 Alcohol abuse, uncomplicated: Secondary | ICD-10-CM | POA: Insufficient documentation

## 2013-09-05 DIAGNOSIS — S4980XA Other specified injuries of shoulder and upper arm, unspecified arm, initial encounter: Secondary | ICD-10-CM | POA: Insufficient documentation

## 2013-09-05 DIAGNOSIS — T148XXA Other injury of unspecified body region, initial encounter: Secondary | ICD-10-CM

## 2013-09-05 MED ORDER — TETANUS-DIPHTH-ACELL PERTUSSIS 5-2.5-18.5 LF-MCG/0.5 IM SUSP
0.5000 mL | Freq: Once | INTRAMUSCULAR | Status: AC
  Administered 2013-09-05: 0.5 mL via INTRAMUSCULAR
  Filled 2013-09-05: qty 0.5
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
## 2013-09-05 NOTE — ED Notes (Signed)
Abigail PA at bedside   

## 2013-09-09 NOTE — ED Provider Notes (Signed)
Medical screening examination/treatment/procedure(s) were performed by non-physician practitioner and as supervising physician I was immediately available for consultation/collaboration.  EKG Interpretation   None         Audree Camel, MD 09/09/13 1739

## 2014-05-18 ENCOUNTER — Emergency Department (HOSPITAL_COMMUNITY)
Admission: EM | Admit: 2014-05-18 | Discharge: 2014-05-18 | Disposition: A | Discharge status: Home / Self Care | Payer: Self-pay | Attending: Emergency Medicine | Admitting: Emergency Medicine

## 2014-05-18 ENCOUNTER — Encounter (HOSPITAL_COMMUNITY): Payer: Self-pay | Admitting: Emergency Medicine

## 2014-05-18 ENCOUNTER — Emergency Department (HOSPITAL_COMMUNITY): Payer: Self-pay

## 2014-05-18 DIAGNOSIS — Y929 Unspecified place or not applicable: Secondary | ICD-10-CM | POA: Insufficient documentation

## 2014-05-18 DIAGNOSIS — S62329A Displaced fracture of shaft of unspecified metacarpal bone, initial encounter for closed fracture: Secondary | ICD-10-CM | POA: Insufficient documentation

## 2014-05-18 DIAGNOSIS — S62339A Displaced fracture of neck of unspecified metacarpal bone, initial encounter for closed fracture: Secondary | ICD-10-CM

## 2014-05-18 DIAGNOSIS — F17200 Nicotine dependence, unspecified, uncomplicated: Secondary | ICD-10-CM | POA: Insufficient documentation

## 2014-05-18 DIAGNOSIS — W010XXA Fall on same level from slipping, tripping and stumbling without subsequent striking against object, initial encounter: Secondary | ICD-10-CM | POA: Insufficient documentation

## 2014-05-18 DIAGNOSIS — Y9389 Activity, other specified: Secondary | ICD-10-CM | POA: Insufficient documentation

## 2014-05-18 MED ORDER — HYDROCODONE-ACETAMINOPHEN 5-325 MG PO TABS: ORAL_TABLET | ORAL | Status: DC

## 2014-05-18 MED ORDER — HYDROCODONE-ACETAMINOPHEN 5-325 MG PO TABS
2.0000 | ORAL_TABLET | Freq: Once | ORAL | Status: AC
  Administered 2014-05-18: 2 via ORAL
  Filled 2014-05-18: qty 2

## 2014-05-18 NOTE — ED Provider Notes (Signed)
Medical screening examination/treatment/procedure(s) were conducted as a shared visit with non-physician practitioner(s) or resident  and myself.  I personally evaluated the patient during the encounter and agree with the findings and plan unless otherwise indicated.    I have personally reviewed any xrays and/ or EKG's with the provider and I agree with interpretation.   Injury 3 days prior. On exam swelling and lateral hand tenderness, nv intact. Xray reviewed and assisted with cast/ recheck of cast placement.  Fup with ortho.  SPLINT APPLICATION Authorized by: Enid SkeensZAVITZ, Terri Malerba M Consent: Verbal consent obtained. Risks and benefits: risks, benefits and alternatives were discussed Consent given by: patient Splint applied by: tech aand ornd myself Location details: right short arm Splint type: orthoglass  Post-procedure: The splinted body part was neurovascularly unchanged following the procedure. Patient tolerance: Patient tolerated the procedure well with no immediate complications.  Dg Hand Complete Right  05/18/2014   CLINICAL DATA:  Status post fall 3 days ago. Pain about the right fifth metacarpal.  EXAM: RIGHT HAND - COMPLETE 3+ VIEW  COMPARISON:  None.  FINDINGS: The patient has an acute nondisplaced and incomplete fracture of the mid diaphysis of the fifth metacarpal with overlying soft tissue swelling. This acute fracture appears superimposed on a remote healed fifth metacarpal fracture. No other acute bony or joint abnormality is identified.  IMPRESSION: Acute nondisplaced and incomplete mid diaphyseal fracture fifth metacarpal.   Electronically Signed   By: Drusilla Kannerhomas  Dalessio M.D.   On: 05/18/2014 09:57   Enid SkeensJoshua M Vernecia Umble, MD 05/18/14 1700

## 2014-05-18 NOTE — ED Notes (Signed)
PA at bedside.

## 2014-05-18 NOTE — ED Notes (Signed)
Pt states that he tripped 3 days ago and fell onto his closed right fist.  C/o pain and swelling since.

## 2014-05-18 NOTE — Discharge Instructions (Signed)
Elevate (Limb above the level of the heart) as much as possible.   Take up to 800mg  of ibuprofen (that is usually 4 over the counter pills)  3 times a day for 5 days. Take with food.  Take vicodin for breakthrough pain, do not drink alcohol, drive, care for children or do other critical tasks while taking vicodin.  Do not hesitate to return to the Emergency Department for any new, worsening or concerning symptoms.   If you do not have a primary care doctor you can establish one at the   [facility]: [address] [place] [id number] [phone]  After you establish care. Let them know you were seen in the emergency room. They must obtain records for further management.

## 2014-05-18 NOTE — ED Notes (Signed)
Ortho at bedside.
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
## 2014-05-18 NOTE — ED Notes (Signed)
Patient was educated not to drive, operate heavy machinery, or drink alcohol while taking narcotic medication.  

## 2014-05-18 NOTE — Progress Notes (Signed)
[facility] provided pt with a list of primary care resources, highlighting [facility], a list of area shelters, information on hot meals and food pantries, and information on free dental clinic.

## 2015-09-28 ENCOUNTER — Encounter (HOSPITAL_COMMUNITY): Payer: Self-pay | Admitting: Emergency Medicine

## 2015-09-28 ENCOUNTER — Emergency Department (HOSPITAL_COMMUNITY)
Admission: EM | Admit: 2015-09-28 | Discharge: 2015-09-29 | Disposition: A | Discharge status: Home / Self Care | Payer: Self-pay | Attending: Emergency Medicine | Admitting: Emergency Medicine

## 2015-09-28 DIAGNOSIS — F10920 Alcohol use, unspecified with intoxication, uncomplicated: Secondary | ICD-10-CM

## 2015-09-28 DIAGNOSIS — F17210 Nicotine dependence, cigarettes, uncomplicated: Secondary | ICD-10-CM | POA: Insufficient documentation

## 2015-09-28 DIAGNOSIS — K4030 Unilateral inguinal hernia, with obstruction, without gangrene, not specified as recurrent: Secondary | ICD-10-CM | POA: Insufficient documentation

## 2015-09-28 DIAGNOSIS — Z9889 Other specified postprocedural states: Secondary | ICD-10-CM | POA: Insufficient documentation

## 2015-09-28 DIAGNOSIS — F10120 Alcohol abuse with intoxication, uncomplicated: Secondary | ICD-10-CM | POA: Insufficient documentation

## 2015-09-28 DIAGNOSIS — K4090 Unilateral inguinal hernia, without obstruction or gangrene, not specified as recurrent: Secondary | ICD-10-CM

## 2015-09-28 DIAGNOSIS — R451 Restlessness and agitation: Secondary | ICD-10-CM | POA: Insufficient documentation

## 2015-09-28 LAB — I-STAT CHEM 8, ED
BUN: 14 mg/dL (ref 6–20)
CALCIUM ION: 1.05 mmol/L — AB (ref 1.13–1.30)
Chloride: 105 mmol/L (ref 101–111)
Creatinine, Ser: 1 mg/dL (ref 0.61–1.24)
Glucose, Bld: 125 mg/dL — ABNORMAL HIGH (ref 65–99)
HCT: 42 % (ref 39.0–52.0)
Hemoglobin: 14.3 g/dL (ref 13.0–17.0)
Potassium: 3.7 mmol/L (ref 3.5–5.1)
SODIUM: 143 mmol/L (ref 135–145)
TCO2: 23 mmol/L (ref 0–100)

## 2015-09-28 NOTE — ED Notes (Signed)
Pt cursing. He was advised to respect the staff while he was here and that language will not be tolerated.

## 2015-09-28 NOTE — ED Notes (Signed)
PA at bedside.

## 2015-09-28 NOTE — ED Provider Notes (Signed)
CSN: [id number]     Arrival date & time 09/28/15  1946 History   First MD Initiated Contact with Patient 09/28/15 2119     Chief Complaint  Patient presents with  . Groin Pain     (Consider location/radiation/quality/duration/timing/severity/associated sxs/prior Treatment) HPI   Patient is a 59 year old male with vague past medical history including abdominal surgery and liver repair, he presents to the ER tonight complaining of right groin pain for 3 weeks and is reportedly intoxicated.  His groin pain is worse when standing up, walking and carrying his heavy backpack. He reports worsening burning and stinging in his right groin.  When he is laying down he is not having any pain. He reports that he is homeless and has not seen a physician for this. He is unable to answer whether or not the hernia has been out for any period of time.  He answered by pushing on his right grown and repeatedly stating "my shit hurts and I want it out of me."  At the time of this history and exam he stated that because he is now calm and lying down in the ER his pain is improved.  Reports that he drank 8 beers prior to calling EMS tonight. Does not have any other pain including abdominal pain, flank pain, swelling or pain in his testicles. He denies nausea, vomiting, fever, chills, sweats, chest pain or shortness of breath.  History reviewed. No pertinent past medical history. Past Surgical History  Procedure Laterality Date  . Liver repair    . Abdominal surgery     No family history on file. Social History  Substance Use Topics  . Smoking status: Current Every Day Smoker    Types: Cigarettes  . Smokeless tobacco: None  . Alcohol Use: Yes    Review of Systems  Reason unable to perform ROS: pt intoxicated, limiting ROS.  Constitutional: Negative.  Negative for fever.  Genitourinary: Negative for scrotal swelling and testicular pain.  Musculoskeletal: Negative.   Skin: Negative.       Allergies   Review of patient's allergies indicates no known allergies.  Home Medications   Prior to Admission medications   Medication Sig Start Date End Date Taking? Authorizing Provider  ibuprofen (ADVIL,MOTRIN) 200 MG tablet Take 400 mg by mouth every 6 (six) hours as needed for moderate pain.   Yes Historical Provider, MD   BP 123/69 mmHg  Pulse 77  Temp(Src) 98.2 F (36.8 C) (Oral)  Resp 19  SpO2 94% Physical Exam  Constitutional: He is oriented to person, place, and time. He appears well-developed and well-nourished. No distress.  Unkempt male, mildly intoxicated with steady gait and on slurred speech  HENT:  Head: Normocephalic and atraumatic.  Nose: Nose normal.  Mouth/Throat: Oropharynx is clear and moist. No oropharyngeal exudate.  Eyes: Conjunctivae and EOM are normal. Pupils are equal, round, and reactive to light. Right eye exhibits no discharge. Left eye exhibits no discharge. No scleral icterus.  Neck: Normal range of motion. No JVD present. No tracheal deviation present. No thyromegaly present.  Cardiovascular: Normal rate, regular rhythm, normal heart sounds and intact distal pulses.  Exam reveals no gallop and no friction rub.   No murmur heard. Pulmonary/Chest: Effort normal and breath sounds normal. No respiratory distress. He has no wheezes. He has no rales. He exhibits no tenderness.  Abdominal: Soft. Bowel sounds are normal. He exhibits no distension and no mass. There is no tenderness. There is no rigidity, no rebound, no guarding  and no CVA tenderness. A hernia is present. Hernia confirmed positive in the right inguinal area.  Abdomen is soft, nondistended, nontender to palpation, BS x4, midline abdominal incision   Musculoskeletal: Normal range of motion. He exhibits no edema or tenderness.  Lymphadenopathy:    He has no cervical adenopathy.  Neurological: He is alert and oriented to person, place, and time. He has normal reflexes. No cranial nerve deficit. He  exhibits normal muscle tone. Coordination and gait normal.  Steady gait, no tremor or asterixis  Skin: Skin is warm and dry. No rash noted. He is not diaphoretic. No erythema. No pallor.  Psychiatric: He has a normal mood and affect. Judgment and thought content normal. His speech is rapid and/or pressured. He is agitated and aggressive.  Nursing note and vitals reviewed.   ED Course  Procedures (including critical care time) Labs Review Labs Reviewed  I-STAT CHEM 8, ED - Abnormal; Notable for the following:    Glucose, Bld 125 (*)    Calcium, Ion 1.05 (*)    All other components within normal limits    Imaging Review No results found. I have personally reviewed and evaluated these images and lab results as part of my medical decision-making.   EKG Interpretation None      MDM   Final diagnoses:  Right inguinal hernia  Alcohol intoxication, uncomplicated (HCC)    Patient is a 59-year-old male, reportedly homeless and intoxicated, presents to the ER with right groin pain 3 weeks. Appears to have a right inguinal hernia which is currently reduced. His abdomen is soft, nontender, nondistended.  Vitals are stable.  At this time I do not suspect incarcerated hernia, obstruction or any acute abdominal pathology. His reportedly been cursing at staff, he minimally cooperative with exam and physical however history is limited due to his intoxicated state. He has been able to ambulate around the ER without difficulty with a steady gait.    Case management has seen the patient and will assist with follow-up. He ultimately may need to see general surgery for hernia repair, no urgent or emergent need for admission or consult at this time. The patient will be allowed to sober up while in the ER, and discharge home when he longer appears to be intoxicated or a threat to himself.  12:42 AM The patient has been eating and drinking, asking for pain medicine, making calls on his cell phone,  and has continued to walk to the bathroom without difficulty.  Nurse and I felt he was stable for discharge at this time.  He was discharged home with follow-up assistance given by [facility] and wellness if he wishes to [facility], and [facility] surgery referral given for eval of hernia.  Medications  acetaminophen (TYLENOL) tablet 1,000 mg (1,000 mg Oral Not Given 09/29/15 0034)   Filed Vitals:   09/28/15 1953 09/28/15 2357 09/29/15 0000  BP: 138/84 129/101 123/69  Pulse: 84 83 77  Temp: 98.2 F (36.8 C)    TempSrc: Oral    Resp: 16 19   SpO2: 98% 95% 94%   Patient repeatedly asked for pain medication, was repeatedly told that he will not be given narcotic pain medicine due to intoxication. He was offered Tylenol to which she responded "I don't want that shit."  At the time of discharge he was escorted out of the ER by security.   Danelle BerryLeisa Knox Holdman, PA-C 09/29/15 [id number]  Doug SouSam Jacubowitz, MD 09/29/15 [phone]

## 2015-09-28 NOTE — ED Notes (Signed)
Pt from a motel c/o right inguinal pain x 3 weeks. Denies injury.  Pain worse with  Movement. ETOH on board.

## 2015-09-28 NOTE — ED Provider Notes (Signed)
Complains of painful right groin for the past 4-5 weeks. He drank several beers today. Pain is worse when he walks is minimal at present. On exam patient is alert and appears intoxicated argumentative Glasgow Coma Score 15 abdomen nondistended. There is a right-sided inguinal hernia which is easily reducible. Not red or warm genitalia normal male  Russell SouSam Layia Walla, MD 09/28/15 2230

## 2015-09-28 NOTE — ED Notes (Signed)
Pt reports that he had about 8 beers tonight. Pt states " My shit hurts and I want it out of me" as he is pressing his right inguinal.

## 2015-09-28 NOTE — ED Notes (Signed)
Pt ambulated to restroom with steady gait.

## 2015-09-28 NOTE — Progress Notes (Signed)
EDCM spoke to patient at bedside. Patient confirms she does not have a pcp or [place].  [facility], informed patient of services there.  EDCM also provided patient with list of pcps who accept self pay patients, list of discount pharmacies and websites needymeds.org and GoodRX.com for medication assistance, phone number to inquire about the orange card, phone number to inquire about Mediciad, phone number to inquire about the Affordable Care Act, financial resources in the community such as local churches, salvation [employer], urban ministries, and dental assistance for uninsured patients.  Patient is homeless.  He reports he is staying in a hotel with his girlfriend currently stating, "I made enough money today to get us a hotel."  [facility] t [facility] and speak to financial counselor in regards to orange card or cone discount.  Patient kept stating, "But baby girl, I ain't got no resources."  Very difficult to try to make patient understand that [facility] offering him resources to assist him financially.  Patient reports [facility] but he will not go back there.  Patient would not allow [facility].  EDCM left resources with patient and strongly encouraged him to follow up with [facility].  Discussed with EDP and EDPA.  Patient thankful for resources.  No further EDCM needs at this time.

## 2015-09-28 NOTE — ED Notes (Signed)
Phone provided per his request.

## 2015-09-28 NOTE — ED Notes (Signed)
Bed: [id number] Expected date:  Expected time:  Means of arrival:  Comments: EMS 35M R groin pain x 3 pain

## 2015-09-29 MED ORDER — ACETAMINOPHEN 500 MG PO TABS: 1000.0000 mg | ORAL_TABLET | Freq: Once | ORAL | Status: DC

## 2015-09-29 NOTE — ED Notes (Addendum)
Pt reports he has no way back to his motel. Pt has been on the phone repeatedly talking to someone. He reports that's his girl "but she can't come get me". Pt was offered a bus pass at 0530 he declines. He also was offered however, he declines.  Pt leaves all discharge papers in his room and states "I don't want that shit". Pt was escorted out of ED by security. Pt ambulates with steady gait and unlit cigarette in mouth.

## 2015-09-29 NOTE — Discharge Instructions (Signed)
Alcohol Intoxication °Alcohol intoxication occurs when you drink enough alcohol that it affects your ability to function. It can be mild or very severe. Drinking a lot of alcohol in a short time is called binge drinking. This can be very harmful. Drinking alcohol can also be more dangerous if you are taking medicines or other drugs. Some of the effects caused by alcohol may include: °· Loss of coordination. °· Changes in mood and behavior. °· Unclear thinking. °· Trouble talking (slurred speech). °· Throwing up (vomiting). °· Confusion. °· Slowed breathing. °· Twitching and shaking (seizures). °· Loss of consciousness. °HOME CARE °· Do not drive after drinking alcohol. °· Drink enough water and fluids to keep your pee (urine) clear or pale yellow. Avoid caffeine. °· Only take medicine as told by your doctor. °GET HELP IF: °· You throw up (vomit) many times. °· You do not feel better after a few days. °· You frequently have alcohol intoxication. Your doctor can help decide if you should see a substance use treatment counselor. °GET HELP RIGHT AWAY IF: °· You become shaky when you stop drinking. °· You have twitching and shaking. °· You throw up blood. It may look bright red or like coffee grounds. °· You notice blood in your poop (bowel movements). °· You become lightheaded or pass out (faint). °MAKE SURE YOU:  °· Understand these instructions. °· Will watch your condition. °· Will get help right away if you are not doing well or get worse. °  °This information is not intended to replace advice given to you by your health care provider. Make sure you discuss any questions you have with your health care provider. °  °Document Released: 04/14/2008 Document Revised: 06/29/2013 Document Reviewed: 04/01/2013 °Elsevier Interactive Patient Education ©2016 Elsevier Inc. ° ° ° ° °Inguinal Hernia, Adult °Muscles help keep everything in the body in its proper place. But if a weak spot in the muscles develops, something can poke  through. That is called a hernia. When this happens in the lower part of the belly (abdomen), it is called an inguinal hernia. (It takes its name from a part of the body in this region called the inguinal canal.) A weak spot in the wall of muscles lets some fat or part of the small intestine bulge through. An inguinal hernia can develop at any age. Men get them more often than women. °CAUSES  °In adults, an inguinal hernia develops over time. °· It can be triggered by: °¨ Suddenly straining the muscles of the lower abdomen. °¨ Lifting heavy objects. °¨ Straining to have a bowel movement. Difficult bowel movements (constipation) can lead to this. °¨ Constant coughing. This may be caused by smoking or lung disease. °¨ Being overweight. °¨ Being pregnant. °¨ Working at a job that requires long periods of standing or heavy lifting. °¨ Having had an inguinal hernia before. °One type can be an emergency situation. It is called a strangulated inguinal hernia. It develops if part of the small intestine slips through the weak spot and cannot get back into the abdomen. The blood supply can be cut off. If that happens, part of the intestine may die. This situation requires emergency surgery. °SYMPTOMS  °Often, a small inguinal hernia has no symptoms. It is found when a healthcare provider does a physical exam. Larger hernias usually have symptoms.  °· In adults, symptoms may include: °¨ A lump in the groin. This is easier to see when the person is standing. It might disappear when   lying down. °¨ In men, a lump in the scrotum. °¨ Pain or burning in the groin. This occurs especially when lifting, straining or coughing. °¨ A dull ache or feeling of pressure in the groin. °· Signs of a strangulated hernia can include: °¨ A bulge in the groin that becomes very painful and tender to the touch. °¨ A bulge that turns red or purple. °¨ Fever, nausea and vomiting. °¨ Inability to have a bowel movement or to pass gas. °DIAGNOSIS  °To  decide if you have an inguinal hernia, a healthcare provider will probably do a physical examination. °· This will include asking questions about any symptoms you have noticed. °· The healthcare provider might feel the groin area and ask you to cough. If an inguinal hernia is felt, the healthcare provider may try to slide it back into the abdomen. °· Usually no other tests are needed. °TREATMENT  °Treatments can vary. The size of the hernia makes a difference. Options include: °· Watchful waiting. This is often suggested if the hernia is small and you have had no symptoms. °¨ No medical procedure will be done unless symptoms develop. °¨ You will need to watch closely for symptoms. If any occur, contact your healthcare provider right away. °· Surgery. This is used if the hernia is larger or you have symptoms. °¨ Open surgery. This is usually an outpatient procedure (you will not stay overnight in a hospital). An cut (incision) is made through the skin in the groin. The hernia is put back inside the abdomen. The weak area in the muscles is then repaired by herniorrhaphy or hernioplasty. Herniorrhaphy: in this type of surgery, the weak muscles are sewn back together. Hernioplasty: a patch or mesh is used to close the weak area in the abdominal wall. °¨ Laparoscopy. In this procedure, a surgeon makes small incisions. A thin tube with a tiny video camera (called a laparoscope) is put into the abdomen. The surgeon repairs the hernia with mesh by looking with the video camera and using two long instruments. °HOME CARE INSTRUCTIONS  °· After surgery to repair an inguinal hernia: °¨ You will need to take pain medicine prescribed by your healthcare provider. Follow all directions carefully. °¨ You will need to take care of the wound from the incision. °¨ Your activity will be restricted for awhile. This will probably include no heavy lifting for several weeks. You also should not do anything too active for a few weeks. When  you can return to work will depend on the type of job that you have. °· During "watchful waiting" periods, you should: °¨ Maintain a healthy weight. °¨ Eat a diet high in fiber (fruits, vegetables and whole grains). °¨ Drink plenty of fluids to avoid constipation. This means drinking enough water and other liquids to keep your urine clear or pale yellow. °¨ Do not lift heavy objects. °¨ Do not stand for long periods of time. °¨ Quit smoking. This should keep you from developing a frequent cough. °SEEK MEDICAL CARE IF:  °· A bulge develops in your groin area. °· You feel pain, a burning sensation or pressure in the groin. This might be worse if you are lifting or straining. °· You develop a fever of more than 100.5° F (38.1° C). °SEEK IMMEDIATE MEDICAL CARE IF:  °· Pain in the groin increases suddenly. °· A bulge in the groin gets bigger suddenly and does not go down. °· For men, there is sudden pain in the scrotum. Or,   the size of the scrotum increases. °· A bulge in the groin area becomes red or purple and is painful to touch. °· You have nausea or vomiting that does not go away. °· You feel your heart beating much faster than normal. °· You cannot have a bowel movement or pass gas. °· You develop a fever of more than 102.0° F (38.9° C). °  °This information is not intended to replace advice given to you by your health care provider. Make sure you discuss any questions you have with your health care provider. °  °Document Released: 03/15/2009 Document Revised: 01/19/2012 Document Reviewed: 04/30/2015 °Elsevier Interactive Patient Education ©2016 Elsevier Inc. ° °

## 2016-05-13 ENCOUNTER — Emergency Department (HOSPITAL_COMMUNITY)
Admission: EM | Admit: 2016-05-13 | Discharge: 2016-05-13 | Disposition: A | Discharge status: Home / Self Care | Payer: Self-pay | Attending: Emergency Medicine | Admitting: Emergency Medicine

## 2016-05-13 ENCOUNTER — Encounter (HOSPITAL_COMMUNITY): Payer: Self-pay

## 2016-05-13 DIAGNOSIS — K4090 Unilateral inguinal hernia, without obstruction or gangrene, not specified as recurrent: Secondary | ICD-10-CM

## 2016-05-13 DIAGNOSIS — F1110 Opioid abuse, uncomplicated: Secondary | ICD-10-CM | POA: Insufficient documentation

## 2016-05-13 DIAGNOSIS — F17210 Nicotine dependence, cigarettes, uncomplicated: Secondary | ICD-10-CM | POA: Insufficient documentation

## 2016-05-13 DIAGNOSIS — Z791 Long term (current) use of non-steroidal anti-inflammatories (NSAID): Secondary | ICD-10-CM | POA: Insufficient documentation

## 2016-05-13 MED ORDER — OXYCODONE-ACETAMINOPHEN 5-325 MG PO TABS: 2.0000 | ORAL_TABLET | ORAL | Status: DC | PRN

## 2016-05-13 MED ORDER — OXYCODONE-ACETAMINOPHEN 5-325 MG PO TABS
1.0000 | ORAL_TABLET | Freq: Once | ORAL | Status: AC
  Administered 2016-05-13: 1 via ORAL
  Filled 2016-05-13: qty 1

## 2016-05-13 NOTE — ED Provider Notes (Signed)
CSN: [id number]     Arrival date & time 05/13/16  0810 History   First MD Initiated Contact with Patient 05/13/16 [phone]     Chief Complaint  Patient presents with  . Groin Pain     (Consider location/radiation/quality/duration/timing/severity/associated sxs/prior Treatment) HPI   Russell [name] is a 60 y.o M who presents to the ED today Complaining of "a knot in my testicle". Patient states this is been present for 1 year. He states that when he lies down the bulge will go away on its own. However, if he walks for long periods of time or lifts anything heavy table to return and causes pain in his right groin and right scrotum. Patient states he is also usually able to push it back in manually. He denies any difficulty having a bowel movement or difficulty urinating. No reported fevers, chills, vomiting or skin discoloration in his groin. Patient states that his girlfriend was in the ED today being evaluated for something else and he figured he might as well get it checked out while he was here.  History reviewed. No pertinent past medical history. Past Surgical History  Procedure Laterality Date  . Liver repair    . Abdominal surgery     History reviewed. No pertinent family history. Social History  Substance Use Topics  . Smoking status: Current Every Day Smoker    Types: Cigarettes  . Smokeless tobacco: None  . Alcohol Use: Yes    Review of Systems  All other systems reviewed and are negative.     Allergies  Review of patient's allergies indicates no known allergies.  Home Medications   Prior to Admission medications   Medication Sig Start Date End Date Taking? Authorizing Provider  ibuprofen (ADVIL,MOTRIN) 200 MG tablet Take 400 mg by mouth every 6 (six) hours as needed for mild pain or moderate pain.    Yes Historical Provider, MD  oxyCODONE-acetaminophen (PERCOCET/ROXICET) 5-325 MG tablet Take 2 tablets by mouth every 4 (four) hours as needed for severe pain. 05/13/16    Samantha Tripp Dowless, PA-C   BP 147/74 mmHg  Pulse 74  Temp(Src) 98.1 F (36.7 C) (Oral)  Resp 18  SpO2 98% Physical Exam  Constitutional: He is oriented to person, place, and time. He appears well-developed and well-nourished. No distress.  HENT:  Head: Normocephalic and atraumatic.  Eyes: Conjunctivae are normal. Right eye exhibits no discharge. Left eye exhibits no discharge. No scleral icterus.  Cardiovascular: Normal rate.   Pulmonary/Chest: Effort normal.  Abdominal: Soft. Bowel sounds are normal. He exhibits no distension and no mass. There is no tenderness. There is no rebound and no guarding.  Small umbilical hernia present, reducible and nontender. Well healed surgical scars.  Genitourinary:  Large R inguinal hernia that extends into R testicle. Mild TTP. Reducible on exam. No skin discoloration.   Neurological: He is alert and oriented to person, place, and time. Coordination normal.  Skin: Skin is warm and dry. No rash noted. He is not diaphoretic. No erythema. No pallor.  Psychiatric: He has a normal mood and affect. His behavior is normal.  Nursing note and vitals reviewed.   ED Course  Procedures (including critical care time) Labs Review Labs Reviewed - No data to display  Imaging Review No results found. I have personally reviewed and evaluated these images and lab results as part of my medical decision-making.   EKG Interpretation None      MDM   Final diagnoses:  Unilateral inguinal hernia without obstruction  or gangrene, recurrence not specified    60 year old male presents the ED to have a "knot in my testicle" evaluated. His symptoms have been present for greater than 1 year. His call from us in the ED today so he figured while he was already here he may as well get it evaluated. Pt appears well in ED, in NAD. All VSS. There is a large right inguinal hernia present on exam. No sign of incarceration or strangulation. It is reducible on exam. No  emergent imaging or surgery consultation needed at this time. He will however need follow-up with general surgery, will provide a referral for him today. Pain managed in ED. We'll also provide patient with support underwear. I discussed the treatment plan with the patient who expresses understanding. Return precautions outlined in patient discharge instructions.  Lester KinsmanSamantha Tripp [place] ValleyDow[place], PA-C 05/13/16 [phone]  Alvira MondayErin Schlossman, MD 05/13/16 [phone]

## 2016-05-13 NOTE — ED Notes (Signed)
Pt here with "knot" on rt testicle.  States it has been there for a year.  Pt states it improves when he lies down.  Here with his significant other.  States since she was being seen, he thought he would get seen also.

## 2016-05-13 NOTE — ED Notes (Signed)
Materials called to get patient 2 pairs of hernia underwear.

## 2016-05-13 NOTE — Discharge Instructions (Signed)
Inguinal Hernia, Adult °Muscles help keep everything in the body in its proper place. But if a weak spot in the muscles develops, something can poke through. That is called a hernia. When this happens in the lower part of the belly (abdomen), it is called an inguinal hernia. (It takes its name from a part of the body in this region called the inguinal canal.) A weak spot in the wall of muscles lets some fat or part of the small intestine bulge through. An inguinal hernia can develop at any age. Men get them more often than women. °CAUSES  °In adults, an inguinal hernia develops over time. °· It can be triggered by: °¨ Suddenly straining the muscles of the lower abdomen. °¨ Lifting heavy objects. °¨ Straining to have a bowel movement. Difficult bowel movements (constipation) can lead to this. °¨ Constant coughing. This may be caused by smoking or lung disease. °¨ Being overweight. °¨ Being pregnant. °¨ Working at a job that requires long periods of standing or heavy lifting. °¨ Having had an inguinal hernia before. °One type can be an emergency situation. It is called a strangulated inguinal hernia. It develops if part of the small intestine slips through the weak spot and cannot get back into the abdomen. The blood supply can be cut off. If that happens, part of the intestine may die. This situation requires emergency surgery. °SYMPTOMS  °Often, a small inguinal hernia has no symptoms. It is found when a healthcare provider does a physical exam. Larger hernias usually have symptoms.  °· In adults, symptoms may include: °¨ A lump in the groin. This is easier to see when the person is standing. It might disappear when lying down. °¨ In men, a lump in the scrotum. °¨ Pain or burning in the groin. This occurs especially when lifting, straining or coughing. °¨ A dull ache or feeling of pressure in the groin. °· Signs of a strangulated hernia can include: °¨ A bulge in the groin that becomes very painful and tender to the  touch. °¨ A bulge that turns red or purple. °¨ Fever, nausea and vomiting. °¨ Inability to have a bowel movement or to pass gas. °DIAGNOSIS  °To decide if you have an inguinal hernia, a healthcare provider will probably do a physical examination. °· This will include asking questions about any symptoms you have noticed. °· The healthcare provider might feel the groin area and ask you to cough. If an inguinal hernia is felt, the healthcare provider may try to slide it back into the abdomen. °· Usually no other tests are needed. °TREATMENT  °Treatments can vary. The size of the hernia makes a difference. Options include: °· Watchful waiting. This is often suggested if the hernia is small and you have had no symptoms. °¨ No medical procedure will be done unless symptoms develop. °¨ You will need to watch closely for symptoms. If any occur, contact your healthcare provider right away. °· Surgery. This is used if the hernia is larger or you have symptoms. °¨ Open surgery. This is usually an outpatient procedure (you will not stay overnight in a hospital). An cut (incision) is made through the skin in the groin. The hernia is put back inside the abdomen. The weak area in the muscles is then repaired by herniorrhaphy or hernioplasty. Herniorrhaphy: in this type of surgery, the weak muscles are sewn back together. Hernioplasty: a patch or mesh is used to close the weak area in the abdominal wall. °¨ Laparoscopy.   In this procedure, a surgeon makes small incisions. A thin tube with a tiny video camera (called a laparoscope) is put into the abdomen. The surgeon repairs the hernia with mesh by looking with the video camera and using two long instruments. HOME CARE INSTRUCTIONS   After surgery to repair an inguinal hernia:  You will need to take pain medicine prescribed by your healthcare provider. Follow all directions carefully.  You will need to take care of the wound from the incision.  Your activity will be  restricted for awhile. This will probably include no heavy lifting for several weeks. You also should not do anything too active for a few weeks. When you can return to work will depend on the type of job that you have.  During "watchful waiting" periods, you should:  Maintain a healthy weight.  Eat a diet high in fiber (fruits, vegetables and whole grains).  Drink plenty of fluids to avoid constipation. This means drinking enough water and other liquids to keep your urine clear or pale yellow.  Do not lift heavy objects.  Do not stand for long periods of time.  Quit smoking. This should keep you from developing a frequent cough. SEEK MEDICAL CARE IF:   A bulge develops in your groin area.  You feel pain, a burning sensation or pressure in the groin. This might be worse if you are lifting or straining.  You develop a fever of more than 100.5 F (38.1 C). SEEK IMMEDIATE MEDICAL CARE IF:   Pain in the groin increases suddenly.  A bulge in the groin gets bigger suddenly and does not go down.  For men, there is sudden pain in the scrotum. Or, the size of the scrotum increases.  A bulge in the groin area becomes red or purple and is painful to touch.  You have nausea or vomiting that does not go away.  You feel your heart beating much faster than normal.  You cannot have a bowel movement or pass gas.  You develop a fever of more than 102.0 F (38.9 C).   This information is not intended to replace advice given to you by your health care provider. Make sure you discuss any questions you have with your health care provider.   Take pain medication as needed. Avoid heavy lifting or straining. [facility] up with [facility] for consultation and re-evaluation of hernia. Return to the ED if you experience severe worsening of your symptoms, if your groin/testicle changes color, vomiting, fever, difficulty having a bowel movement or urinating, if you cannot reduce the hernia.

## 2016-08-26 ENCOUNTER — Emergency Department (HOSPITAL_COMMUNITY)
Admission: EM | Admit: 2016-08-26 | Discharge: 2016-08-26 | Disposition: A | Discharge status: Home / Self Care | Payer: Self-pay | Attending: Emergency Medicine | Admitting: Emergency Medicine

## 2016-08-26 ENCOUNTER — Encounter (HOSPITAL_COMMUNITY): Payer: Self-pay | Admitting: *Deleted

## 2016-08-26 DIAGNOSIS — F17210 Nicotine dependence, cigarettes, uncomplicated: Secondary | ICD-10-CM | POA: Insufficient documentation

## 2016-08-26 DIAGNOSIS — F1110 Opioid abuse, uncomplicated: Secondary | ICD-10-CM | POA: Insufficient documentation

## 2016-08-26 DIAGNOSIS — K4090 Unilateral inguinal hernia, without obstruction or gangrene, not specified as recurrent: Secondary | ICD-10-CM | POA: Insufficient documentation

## 2016-08-26 HISTORY — DX: Unilateral inguinal hernia, without obstruction or gangrene, not specified as recurrent: K40.90

## 2016-08-26 MED ORDER — IBUPROFEN 200 MG PO TABS
400.0000 mg | ORAL_TABLET | Freq: Once | ORAL | Status: AC
  Administered 2016-08-26: 400 mg via ORAL
  Filled 2016-08-26: qty 2
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
## 2016-08-26 NOTE — Discharge Instructions (Signed)
The best thing to do for your hernia, when it is hurting is lying down and relaxing. You can try to push it back up, by gently applying pressure to the swollen area in your scrotum.   [facility] conditions.  Follow-up with the surgeon. If you would like to get the hernia repaired.  Return to the emergency department if you're unable to eat or cannot stop vomiting.

## 2016-08-26 NOTE — ED Triage Notes (Signed)
Patient was dx w/right inguinal hernia last year and has been evaluated for pain associated with that several times over past year.  He was referred to [facility], but does not have insurance, so they would not operate.  Patient is homeless.  Patient states the hernia is always descended if he is up and moving around.  If patient is in recumbent position, the hernia reduces itself.  He states pain is so intense he vomited x2 yesterday.

## 2017-02-19 ENCOUNTER — Emergency Department (HOSPITAL_COMMUNITY): Payer: Self-pay

## 2017-02-19 ENCOUNTER — Emergency Department (HOSPITAL_COMMUNITY)
Admission: EM | Admit: 2017-02-19 | Discharge: 2017-02-19 | Disposition: A | Discharge status: Home / Self Care | Payer: Self-pay | Attending: Emergency Medicine | Admitting: Emergency Medicine

## 2017-02-19 ENCOUNTER — Encounter (HOSPITAL_COMMUNITY): Payer: Self-pay | Admitting: Emergency Medicine

## 2017-02-19 DIAGNOSIS — K4090 Unilateral inguinal hernia, without obstruction or gangrene, not specified as recurrent: Secondary | ICD-10-CM | POA: Insufficient documentation

## 2017-02-19 DIAGNOSIS — F17210 Nicotine dependence, cigarettes, uncomplicated: Secondary | ICD-10-CM | POA: Insufficient documentation

## 2017-02-19 LAB — CBC
HEMATOCRIT: 41 % (ref 39.0–52.0)
Hemoglobin: 13.9 g/dL (ref 13.0–17.0)
MCH: 32.6 pg (ref 26.0–34.0)
MCHC: 33.9 g/dL (ref 30.0–36.0)
MCV: 96.2 fL (ref 78.0–100.0)
Platelets: 279 10*3/uL (ref 150–400)
RBC: 4.26 MIL/uL (ref 4.22–5.81)
RDW: 11.9 % (ref 11.5–15.5)
WBC: 6.4 10*3/uL (ref 4.0–10.5)

## 2017-02-19 LAB — BASIC METABOLIC PANEL
Anion gap: 7 (ref 5–15)
BUN: 10 mg/dL (ref 6–20)
CALCIUM: 9 mg/dL (ref 8.9–10.3)
CO2: 26 mmol/L (ref 22–32)
Chloride: 107 mmol/L (ref 101–111)
Creatinine, Ser: 0.68 mg/dL (ref 0.61–1.24)
Glucose, Bld: 104 mg/dL — ABNORMAL HIGH (ref 65–99)
Potassium: 4.1 mmol/L (ref 3.5–5.1)
Sodium: 140 mmol/L (ref 135–145)

## 2017-02-19 NOTE — ED Notes (Signed)
Patient transported to X-ray 
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
## 2017-02-19 NOTE — Discharge Instructions (Signed)
**  CONTACT A GENERAL SURGERY CLINIC LISTED BELOW TO HAVE YOUR HERNIA REPAIRED** RETURN TO ER IF YOU CANNOT PASS GAS OR STOOL OR CANNOT URINATE
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
## 2017-02-22 ENCOUNTER — Encounter (HOSPITAL_COMMUNITY): Payer: Self-pay | Admitting: *Deleted

## 2017-02-22 ENCOUNTER — Emergency Department (HOSPITAL_COMMUNITY): Payer: Self-pay

## 2017-02-22 ENCOUNTER — Emergency Department (HOSPITAL_COMMUNITY)
Admission: EM | Admit: 2017-02-22 | Discharge: 2017-02-23 | Disposition: A | Discharge status: Home / Self Care | Payer: Self-pay | Attending: Emergency Medicine | Admitting: Emergency Medicine

## 2017-02-22 DIAGNOSIS — F10929 Alcohol use, unspecified with intoxication, unspecified: Secondary | ICD-10-CM

## 2017-02-22 DIAGNOSIS — F17210 Nicotine dependence, cigarettes, uncomplicated: Secondary | ICD-10-CM | POA: Insufficient documentation

## 2017-02-22 DIAGNOSIS — Z79899 Other long term (current) drug therapy: Secondary | ICD-10-CM | POA: Insufficient documentation

## 2017-02-22 DIAGNOSIS — F10120 Alcohol abuse with intoxication, uncomplicated: Secondary | ICD-10-CM | POA: Insufficient documentation

## 2017-02-22 NOTE — ED Notes (Signed)
Bed: ZO10 Expected date:  Expected time:  Means of arrival:  Comments: ETOH ? responiveness
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
## 2017-02-22 NOTE — ED Notes (Signed)
Pt is refusing lab work, Charity fundraiser aware

## 2017-02-22 NOTE — ED Triage Notes (Signed)
Pt brought in by EMS for ETOH.

## 2017-02-22 NOTE — ED Notes (Signed)
Pt is awake and declining further care including the ordered blood work, Publishing copy and imaging. I have notified the EDP.

## 2017-02-23 ENCOUNTER — Ambulatory Visit (HOSPITAL_COMMUNITY): Admission: RE | Admit: 2017-02-23 | Payer: Self-pay | Source: Ambulatory Visit

## 2017-02-23 NOTE — ED Provider Notes (Addendum)
  Physical Exam  BP 138/65 (BP Location: Left Arm)   Pulse 95   Temp 98.1 F (36.7 C) (Oral)   Resp 18   SpO2 96%   Physical Exam  ED Course  Procedures  MDM  PT here in the ER for intoxication. Pt reassessed, and he is awake, but doesn't feel quite stable yet to go home. No SI/HI. Pt unsure why or how he got here. Denies drug use. Admits to heavy drinking.  Will monitor.    Derwood Kaplan, MD 02/23/17 0400  4:49 AM Patient is clinically sober. He is talking coherently, gait is normal, and is demonstrating rational thought process. We shall discharge him shortly, and we have discussed the warning signs of alcohol withdrawal with him verbally, and the information will be provided with the discharge instructions as well.      Derwood Kaplan, MD 02/23/17 [phone]

## 2017-02-23 NOTE — ED Notes (Signed)
Patient allowed RN to collect VS, Pt states he still feels bad/intoxicated. Pt given coke and ambulated to restroom without assistance.

## 2017-04-07 ENCOUNTER — Emergency Department (HOSPITAL_COMMUNITY): Payer: Self-pay

## 2017-04-07 ENCOUNTER — Inpatient Hospital Stay (HOSPITAL_COMMUNITY)
Admission: EM | Admit: 2017-04-07 | Discharge: 2017-04-13 | DRG: 352 | Disposition: A | Discharge status: Home / Self Care | Payer: Self-pay | Attending: General Surgery | Admitting: General Surgery

## 2017-04-07 ENCOUNTER — Encounter (HOSPITAL_COMMUNITY): Payer: Self-pay

## 2017-04-07 DIAGNOSIS — W19XXXA Unspecified fall, initial encounter: Secondary | ICD-10-CM | POA: Diagnosis present

## 2017-04-07 DIAGNOSIS — Z590 Homelessness: Secondary | ICD-10-CM

## 2017-04-07 DIAGNOSIS — F17210 Nicotine dependence, cigarettes, uncomplicated: Secondary | ICD-10-CM | POA: Diagnosis present

## 2017-04-07 DIAGNOSIS — F1010 Alcohol abuse, uncomplicated: Secondary | ICD-10-CM | POA: Diagnosis present

## 2017-04-07 DIAGNOSIS — Z8719 Personal history of other diseases of the digestive system: Secondary | ICD-10-CM

## 2017-04-07 DIAGNOSIS — Z79899 Other long term (current) drug therapy: Secondary | ICD-10-CM

## 2017-04-07 DIAGNOSIS — K4030 Unilateral inguinal hernia, with obstruction, without gangrene, not specified as recurrent: Principal | ICD-10-CM | POA: Diagnosis present

## 2017-04-07 DIAGNOSIS — F1990 Other psychoactive substance use, unspecified, uncomplicated: Secondary | ICD-10-CM | POA: Diagnosis present

## 2017-04-07 DIAGNOSIS — M25561 Pain in right knee: Secondary | ICD-10-CM | POA: Diagnosis present

## 2017-04-07 HISTORY — DX: Alcohol abuse, uncomplicated: F10.10

## 2017-04-07 LAB — CBC WITH DIFFERENTIAL/PLATELET
BASOS ABS: 0 10*3/uL (ref 0.0–0.1)
Basophils Relative: 0 %
Eosinophils Absolute: 0.2 10*3/uL (ref 0.0–0.7)
Eosinophils Relative: 2 %
HEMATOCRIT: 39.3 % (ref 39.0–52.0)
Hemoglobin: 13 g/dL (ref 13.0–17.0)
LYMPHS ABS: 2.1 10*3/uL (ref 0.7–4.0)
Lymphocytes Relative: 24 %
MCH: 31.8 pg (ref 26.0–34.0)
MCHC: 33.1 g/dL (ref 30.0–36.0)
MCV: 96.1 fL (ref 78.0–100.0)
MONOS PCT: 10 %
Monocytes Absolute: 0.9 10*3/uL (ref 0.1–1.0)
NEUTROS ABS: 5.4 10*3/uL (ref 1.7–7.7)
Neutrophils Relative %: 64 %
Platelets: 304 10*3/uL (ref 150–400)
RBC: 4.09 MIL/uL — ABNORMAL LOW (ref 4.22–5.81)
RDW: 11.6 % (ref 11.5–15.5)
WBC: 8.6 10*3/uL (ref 4.0–10.5)

## 2017-04-07 LAB — URINALYSIS, ROUTINE W REFLEX MICROSCOPIC
Bacteria, UA: NONE SEEN
Bilirubin Urine: NEGATIVE
Glucose, UA: NEGATIVE mg/dL
Ketones, ur: NEGATIVE mg/dL
Leukocytes, UA: NEGATIVE
Nitrite: NEGATIVE
PROTEIN: NEGATIVE mg/dL
Specific Gravity, Urine: 1.018 (ref 1.005–1.030)
WBC UA: NONE SEEN WBC/hpf (ref 0–5)
pH: 5 (ref 5.0–8.0)

## 2017-04-07 LAB — COMPREHENSIVE METABOLIC PANEL
ALBUMIN: 3.3 g/dL — AB (ref 3.5–5.0)
ALK PHOS: 79 U/L (ref 38–126)
ALT: 27 U/L (ref 17–63)
AST: 31 U/L (ref 15–41)
Anion gap: 7 (ref 5–15)
BILIRUBIN TOTAL: 0.6 mg/dL (ref 0.3–1.2)
BUN: 11 mg/dL (ref 6–20)
CALCIUM: 8.6 mg/dL — AB (ref 8.9–10.3)
CO2: 23 mmol/L (ref 22–32)
Chloride: 107 mmol/L (ref 101–111)
Creatinine, Ser: 0.78 mg/dL (ref 0.61–1.24)
GFR calc Af Amer: >60 mL/min (ref >60)
GFR calc non Af Amer: >60 mL/min (ref >60)
Glucose, Bld: 105 mg/dL — ABNORMAL HIGH (ref 65–99)
POTASSIUM: 3.8 mmol/L (ref 3.5–5.1)
Sodium: 137 mmol/L (ref 135–145)
Total Protein: 7 g/dL (ref 6.5–8.1)

## 2017-04-07 LAB — I-STAT CG4 LACTIC ACID, ED: Lactic Acid, Venous: 0.92 mmol/L (ref 0.5–1.9)

## 2017-04-07 LAB — ETHANOL

## 2017-04-07 MED ORDER — DIPHENHYDRAMINE HCL 50 MG/ML IJ SOLN
25.0000 mg | Freq: Four times a day (QID) | INTRAMUSCULAR | Status: DC | PRN
  Administered 2017-04-13: 25 mg via INTRAVENOUS
  Filled 2017-04-07: qty 1

## 2017-04-07 MED ORDER — LORAZEPAM 2 MG/ML IJ SOLN
0.0000 mg | Freq: Two times a day (BID) | INTRAMUSCULAR | Status: AC
  Administered 2017-04-09: 2 mg via INTRAVENOUS
  Administered 2017-04-10: 1 mg via INTRAVENOUS
  Filled 2017-04-07 (×4): qty 1

## 2017-04-07 MED ORDER — MORPHINE SULFATE (PF) 4 MG/ML IV SOLN
4.0000 mg | Freq: Once | INTRAVENOUS | Status: AC
  Administered 2017-04-07: 4 mg via INTRAVENOUS
  Filled 2017-04-07: qty 1

## 2017-04-07 MED ORDER — LORAZEPAM 2 MG/ML IJ SOLN: 1.0000 mg | Freq: Four times a day (QID) | INTRAMUSCULAR | Status: AC | PRN

## 2017-04-07 MED ORDER — ONDANSETRON HCL 4 MG/2ML IJ SOLN
4.0000 mg | Freq: Once | INTRAMUSCULAR | Status: AC
  Administered 2017-04-07: 4 mg via INTRAVENOUS
  Filled 2017-04-07: qty 2

## 2017-04-07 MED ORDER — LORAZEPAM 1 MG PO TABS
1.0000 mg | ORAL_TABLET | Freq: Four times a day (QID) | ORAL | Status: AC | PRN
  Administered 2017-04-07: 1 mg via ORAL
  Filled 2017-04-07: qty 1

## 2017-04-07 MED ORDER — MORPHINE SULFATE (PF) 4 MG/ML IV SOLN
2.0000 mg | INTRAVENOUS | Status: DC | PRN
  Administered 2017-04-07 – 2017-04-08 (×7): 4 mg via INTRAVENOUS
  Filled 2017-04-07 (×7): qty 1

## 2017-04-07 MED ORDER — ONDANSETRON 4 MG PO TBDP: 4.0000 mg | ORAL_TABLET | Freq: Four times a day (QID) | ORAL | Status: DC | PRN

## 2017-04-07 MED ORDER — DIPHENHYDRAMINE HCL 25 MG PO CAPS: 25.0000 mg | ORAL_CAPSULE | Freq: Four times a day (QID) | ORAL | Status: DC | PRN

## 2017-04-07 MED ORDER — CHLORHEXIDINE GLUCONATE CLOTH 2 % EX PADS: 6.0000 | MEDICATED_PAD | Freq: Once | CUTANEOUS | Status: DC

## 2017-04-07 MED ORDER — NICOTINE 21 MG/24HR TD PT24
21.0000 mg | MEDICATED_PATCH | Freq: Once | TRANSDERMAL | Status: AC
  Administered 2017-04-07: 21 mg via TRANSDERMAL
  Filled 2017-04-07: qty 1

## 2017-04-07 MED ORDER — SODIUM CHLORIDE 0.9 % IV SOLN
INTRAVENOUS | Status: DC
  Administered 2017-04-07 (×2): via INTRAVENOUS

## 2017-04-07 MED ORDER — ADULT MULTIVITAMIN W/MINERALS CH
1.0000 | ORAL_TABLET | Freq: Every day | ORAL | Status: DC
  Administered 2017-04-08 – 2017-04-10 (×3): 1 via ORAL
  Filled 2017-04-07 (×4): qty 1

## 2017-04-07 MED ORDER — DEXTROSE 5 % IV SOLN
2.0000 g | INTRAVENOUS | Status: DC
  Filled 2017-04-07: qty 2

## 2017-04-07 MED ORDER — IOPAMIDOL (ISOVUE-300) INJECTION 61%
INTRAVENOUS | Status: AC
  Administered 2017-04-07: 100 mL
  Filled 2017-04-07: qty 100

## 2017-04-07 MED ORDER — CHLORHEXIDINE GLUCONATE CLOTH 2 % EX PADS
6.0000 | MEDICATED_PAD | Freq: Once | CUTANEOUS | Status: AC
  Administered 2017-04-07: 6 via TOPICAL

## 2017-04-07 MED ORDER — ONDANSETRON HCL 4 MG/2ML IJ SOLN: 4.0000 mg | Freq: Four times a day (QID) | INTRAMUSCULAR | Status: DC | PRN

## 2017-04-07 MED ORDER — THIAMINE HCL 100 MG/ML IJ SOLN
100.0000 mg | Freq: Every day | INTRAMUSCULAR | Status: DC
  Administered 2017-04-07: 100 mg via INTRAVENOUS
  Filled 2017-04-07: qty 2

## 2017-04-07 MED ORDER — FOLIC ACID 1 MG PO TABS
1.0000 mg | ORAL_TABLET | Freq: Every day | ORAL | Status: DC
  Administered 2017-04-08 – 2017-04-10 (×3): 1 mg via ORAL
  Filled 2017-04-07 (×4): qty 1

## 2017-04-07 MED ORDER — VITAMIN B-1 100 MG PO TABS
100.0000 mg | ORAL_TABLET | Freq: Every day | ORAL | Status: DC
  Administered 2017-04-08 – 2017-04-10 (×3): 100 mg via ORAL
  Filled 2017-04-07 (×4): qty 1

## 2017-04-07 MED ORDER — PANTOPRAZOLE SODIUM 40 MG IV SOLR
40.0000 mg | Freq: Every day | INTRAVENOUS | Status: DC
  Administered 2017-04-07 – 2017-04-08 (×2): 40 mg via INTRAVENOUS
  Filled 2017-04-07 (×2): qty 40

## 2017-04-07 MED ORDER — SODIUM CHLORIDE 0.9 % IV SOLN: Freq: Once | INTRAVENOUS | Status: DC

## 2017-04-07 MED ORDER — LORAZEPAM 2 MG/ML IJ SOLN
0.0000 mg | Freq: Four times a day (QID) | INTRAMUSCULAR | Status: AC
  Filled 2017-04-07 (×2): qty 1

## 2017-04-07 NOTE — Progress Notes (Signed)
Pt arrived to floor in stable condition from ED. Pt complaining of being hungry. Pt educated that he has an order for clear liquids. Pt states that someone previously told him he could eat "real" food. Encouraged pt to call to order clear liquid tray as he will be NPO for surgery at midnight.

## 2017-04-07 NOTE — ED Notes (Signed)
Patient's girlfriend may be reached if needed, per patient request: Inetta Fermoina at [phone].
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
## 2017-04-07 NOTE — ED Notes (Signed)
Patient transported to X-ray 

## 2017-04-07 NOTE — ED Triage Notes (Signed)
Per Pt, Pt is coming from home with complaints of pain at site of hernia. Hx of hernia for 1.5 years with no repair. Reports it is becoming difficult to have a bowel movement. Fell yesterday due to pain and hit his right knee.

## 2017-04-07 NOTE — ED Notes (Signed)
Patient transported to CT 

## 2017-04-08 ENCOUNTER — Encounter (HOSPITAL_COMMUNITY): Admission: EM | Disposition: A | Discharge status: Home / Self Care | Payer: Self-pay | Source: Home / Self Care

## 2017-04-08 ENCOUNTER — Ambulatory Visit (HOSPITAL_COMMUNITY): Admission: RE | Admit: 2017-04-08 | Payer: Self-pay | Source: Ambulatory Visit | Admitting: General Surgery

## 2017-04-08 ENCOUNTER — Inpatient Hospital Stay (HOSPITAL_COMMUNITY): Payer: Self-pay | Admitting: Anesthesiology

## 2017-04-08 ENCOUNTER — Encounter (HOSPITAL_COMMUNITY): Payer: Self-pay | Admitting: General Practice

## 2017-04-08 HISTORY — PX: INSERTION OF MESH: SHX5868

## 2017-04-08 HISTORY — PX: INGUINAL HERNIA REPAIR: SHX194

## 2017-04-08 LAB — SURGICAL PCR SCREEN
MRSA, PCR: POSITIVE — AB
STAPHYLOCOCCUS AUREUS: POSITIVE — AB

## 2017-04-08 LAB — HIV ANTIBODY (ROUTINE TESTING W REFLEX): HIV SCREEN 4TH GENERATION: NONREACTIVE

## 2017-04-08 SURGERY — REPAIR, HERNIA, INGUINAL, INCARCERATED
Anesthesia: General | Laterality: Right

## 2017-04-08 MED ORDER — LACTATED RINGERS IV SOLN
INTRAVENOUS | Status: DC
  Administered 2017-04-08 (×2): via INTRAVENOUS

## 2017-04-08 MED ORDER — ENOXAPARIN SODIUM 40 MG/0.4ML ~~LOC~~ SOLN
40.0000 mg | SUBCUTANEOUS | Status: DC
  Administered 2017-04-09 – 2017-04-10 (×2): 40 mg via SUBCUTANEOUS
  Filled 2017-04-08 (×2): qty 0.4

## 2017-04-08 MED ORDER — BUPIVACAINE-EPINEPHRINE 0.5% -1:200000 IJ SOLN
INTRAMUSCULAR | Status: DC | PRN
  Administered 2017-04-08: 4 mL

## 2017-04-08 MED ORDER — LIDOCAINE 2% (20 MG/ML) 5 ML SYRINGE
INTRAMUSCULAR | Status: AC
  Filled 2017-04-08: qty 5

## 2017-04-08 MED ORDER — PROPOFOL 10 MG/ML IV BOLUS
INTRAVENOUS | Status: DC | PRN
  Administered 2017-04-08: 150 mg via INTRAVENOUS

## 2017-04-08 MED ORDER — DOCUSATE SODIUM 100 MG PO CAPS
100.0000 mg | ORAL_CAPSULE | Freq: Two times a day (BID) | ORAL | Status: DC
  Administered 2017-04-08 – 2017-04-12 (×6): 100 mg via ORAL
  Filled 2017-04-08 (×9): qty 1

## 2017-04-08 MED ORDER — CEFOTETAN DISODIUM 2 G IJ SOLR
INTRAMUSCULAR | Status: DC | PRN
  Administered 2017-04-08: 2 g via INTRAVENOUS

## 2017-04-08 MED ORDER — HYDROMORPHONE HCL 1 MG/ML IJ SOLN
0.2500 mg | INTRAMUSCULAR | Status: DC | PRN
  Administered 2017-04-08: 0.5 mg via INTRAVENOUS

## 2017-04-08 MED ORDER — HYDROMORPHONE HCL 1 MG/ML IJ SOLN
INTRAMUSCULAR | Status: AC
  Filled 2017-04-08: qty 0.5

## 2017-04-08 MED ORDER — FENTANYL CITRATE (PF) 100 MCG/2ML IJ SOLN
100.0000 ug | Freq: Once | INTRAMUSCULAR | Status: AC
  Administered 2017-04-08: 100 ug via INTRAVENOUS

## 2017-04-08 MED ORDER — NICOTINE 21 MG/24HR TD PT24
21.0000 mg | MEDICATED_PATCH | Freq: Once | TRANSDERMAL | Status: AC
  Administered 2017-04-08: 21 mg via TRANSDERMAL
  Filled 2017-04-08: qty 1

## 2017-04-08 MED ORDER — BUPIVACAINE-EPINEPHRINE (PF) 0.5% -1:200000 IJ SOLN
INTRAMUSCULAR | Status: DC | PRN
  Administered 2017-04-08: 30 mL

## 2017-04-08 MED ORDER — ROCURONIUM BROMIDE 100 MG/10ML IV SOLN
INTRAVENOUS | Status: DC | PRN
  Administered 2017-04-08: 40 mg via INTRAVENOUS
  Administered 2017-04-08: 20 mg via INTRAVENOUS

## 2017-04-08 MED ORDER — BUPIVACAINE-EPINEPHRINE (PF) 0.5% -1:200000 IJ SOLN
INTRAMUSCULAR | Status: AC
  Filled 2017-04-08: qty 1.8

## 2017-04-08 MED ORDER — FENTANYL CITRATE (PF) 100 MCG/2ML IJ SOLN
INTRAMUSCULAR | Status: AC
  Administered 2017-04-08: 100 ug via INTRAVENOUS
  Filled 2017-04-08: qty 2

## 2017-04-08 MED ORDER — OXYCODONE HCL 5 MG PO TABS
5.0000 mg | ORAL_TABLET | ORAL | Status: DC | PRN
  Administered 2017-04-08 – 2017-04-12 (×14): 10 mg via ORAL
  Filled 2017-04-08 (×15): qty 2

## 2017-04-08 MED ORDER — SODIUM CHLORIDE 0.9 % IR SOLN
Status: DC | PRN
  Administered 2017-04-08: 500 mL

## 2017-04-08 MED ORDER — MIDAZOLAM HCL 2 MG/2ML IJ SOLN
2.0000 mg | Freq: Once | INTRAMUSCULAR | Status: AC
  Administered 2017-04-08: 2 mg via INTRAVENOUS

## 2017-04-08 MED ORDER — SUCCINYLCHOLINE CHLORIDE 20 MG/ML IJ SOLN
INTRAMUSCULAR | Status: DC | PRN
  Administered 2017-04-08: 100 mg via INTRAVENOUS

## 2017-04-08 MED ORDER — MORPHINE SULFATE (PF) 4 MG/ML IV SOLN
1.0000 mg | INTRAVENOUS | Status: DC | PRN
  Administered 2017-04-08 – 2017-04-09 (×3): 4 mg via INTRAVENOUS
  Filled 2017-04-08 (×3): qty 1

## 2017-04-08 MED ORDER — PROPOFOL 10 MG/ML IV BOLUS
INTRAVENOUS | Status: AC
  Filled 2017-04-08: qty 20

## 2017-04-08 MED ORDER — SODIUM CHLORIDE 0.9 % IV SOLN
INTRAVENOUS | Status: DC
  Administered 2017-04-08 – 2017-04-09 (×3): via INTRAVENOUS

## 2017-04-08 MED ORDER — CHLORHEXIDINE GLUCONATE CLOTH 2 % EX PADS
6.0000 | MEDICATED_PAD | Freq: Every day | CUTANEOUS | Status: AC
  Administered 2017-04-09 – 2017-04-12 (×3): 6 via TOPICAL

## 2017-04-08 MED ORDER — BUPIVACAINE-EPINEPHRINE (PF) 0.5% -1:200000 IJ SOLN
INTRAMUSCULAR | Status: AC
  Filled 2017-04-08: qty 30

## 2017-04-08 MED ORDER — LIDOCAINE HCL (CARDIAC) 20 MG/ML IV SOLN
INTRAVENOUS | Status: DC | PRN
  Administered 2017-04-08: 60 mg via INTRAVENOUS

## 2017-04-08 MED ORDER — FENTANYL CITRATE (PF) 100 MCG/2ML IJ SOLN
INTRAMUSCULAR | Status: DC | PRN
  Administered 2017-04-08: 50 ug via INTRAVENOUS

## 2017-04-08 MED ORDER — CEFOTETAN DISODIUM-DEXTROSE 2-2.08 GM-% IV SOLR
INTRAVENOUS | Status: AC
  Filled 2017-04-08: qty 50

## 2017-04-08 MED ORDER — MUPIROCIN 2 % EX OINT
1.0000 "application " | TOPICAL_OINTMENT | Freq: Two times a day (BID) | CUTANEOUS | Status: DC
  Administered 2017-04-08 – 2017-04-11 (×7): 1 via NASAL
  Filled 2017-04-08 (×2): qty 22

## 2017-04-08 MED ORDER — ONDANSETRON HCL 4 MG/2ML IJ SOLN
INTRAMUSCULAR | Status: DC | PRN
  Administered 2017-04-08: 4 mg via INTRAVENOUS

## 2017-04-08 MED ORDER — ACETAMINOPHEN 650 MG RE SUPP: 650.0000 mg | Freq: Four times a day (QID) | RECTAL | Status: DC | PRN

## 2017-04-08 MED ORDER — MIDAZOLAM HCL 2 MG/2ML IJ SOLN
INTRAMUSCULAR | Status: AC
  Administered 2017-04-08: 2 mg via INTRAVENOUS
  Filled 2017-04-08: qty 2

## 2017-04-08 MED ORDER — FENTANYL CITRATE (PF) 250 MCG/5ML IJ SOLN
INTRAMUSCULAR | Status: AC
  Filled 2017-04-08: qty 5

## 2017-04-08 MED ORDER — ACETAMINOPHEN 325 MG PO TABS
650.0000 mg | ORAL_TABLET | Freq: Four times a day (QID) | ORAL | Status: DC | PRN
  Administered 2017-04-09: 650 mg via ORAL
  Filled 2017-04-08: qty 2

## 2017-04-08 MED ORDER — SUGAMMADEX SODIUM 200 MG/2ML IV SOLN
INTRAVENOUS | Status: DC | PRN
  Administered 2017-04-08: 200 mg via INTRAVENOUS

## 2017-04-08 MED ORDER — MIDAZOLAM HCL 2 MG/2ML IJ SOLN
INTRAMUSCULAR | Status: AC
  Filled 2017-04-08: qty 2

## 2017-04-08 SURGICAL SUPPLY — 38 items
BLADE CLIPPER SURG (BLADE) IMPLANT
CANISTER SUCT 3000ML PPV (MISCELLANEOUS) ×3 IMPLANT
COVER SURGICAL LIGHT HANDLE (MISCELLANEOUS) ×3 IMPLANT
DRAPE LAPAROSCOPIC ABDOMINAL (DRAPES) ×3 IMPLANT
DRAPE UTILITY XL STRL (DRAPES) ×6 IMPLANT
ELECT REM PT RETURN 9FT ADLT (ELECTROSURGICAL) ×3
ELECTRODE REM PT RTRN 9FT ADLT (ELECTROSURGICAL) ×1 IMPLANT
GAUZE SPONGE 4X4 12PLY STRL (GAUZE/BANDAGES/DRESSINGS) IMPLANT
GLOVE BIO SURGEON STRL SZ7 (GLOVE) ×3 IMPLANT
GLOVE BIO SURGEON STRL SZ7.5 (GLOVE) ×3 IMPLANT
GLOVE BIOGEL PI IND STRL 7.5 (GLOVE) ×2 IMPLANT
GLOVE BIOGEL PI IND STRL 8 (GLOVE) ×1 IMPLANT
GLOVE BIOGEL PI INDICATOR 7.5 (GLOVE) ×4
GLOVE BIOGEL PI INDICATOR 8 (GLOVE) ×2
GOWN STRL REUS W/ TWL LRG LVL3 (GOWN DISPOSABLE) ×2 IMPLANT
GOWN STRL REUS W/TWL LRG LVL3 (GOWN DISPOSABLE) ×4
KIT BASIN OR (CUSTOM PROCEDURE TRAY) ×3 IMPLANT
KIT ROOM TURNOVER OR (KITS) ×3 IMPLANT
MESH ULTRAPRO 3X6 7.6X15CM (Mesh General) ×3 IMPLANT
NEEDLE HYPO 25GX1X1/2 BEV (NEEDLE) ×3 IMPLANT
NS IRRIG 1000ML POUR BTL (IV SOLUTION) ×3 IMPLANT
PACK GENERAL/GYN (CUSTOM PROCEDURE TRAY) ×3 IMPLANT
PAD ARMBOARD 7.5X6 YLW CONV (MISCELLANEOUS) ×6 IMPLANT
STAPLER VISISTAT 35W (STAPLE) IMPLANT
SUT MNCRL AB 4-0 PS2 18 (SUTURE) ×3 IMPLANT
SUT NOVA NAB GS-21 0 18 T12 DT (SUTURE) IMPLANT
SUT NOVA NAB GS-21 1 T12 (SUTURE) IMPLANT
SUT VIC AB 2-0 SH 18 (SUTURE) ×3 IMPLANT
SUT VIC AB 2-0 SH 27 (SUTURE) ×2
SUT VIC AB 2-0 SH 27X BRD (SUTURE) ×1 IMPLANT
SUT VIC AB 3-0 SH 18 (SUTURE) ×3 IMPLANT
SUT VIC AB 3-0 SH 27 (SUTURE) ×2
SUT VIC AB 3-0 SH 27XBRD (SUTURE) ×1 IMPLANT
SYR CONTROL 10ML LL (SYRINGE) ×3 IMPLANT
TOWEL OR 17X24 6PK STRL BLUE (TOWEL DISPOSABLE) ×3 IMPLANT
TOWEL OR 17X26 10 PK STRL BLUE (TOWEL DISPOSABLE) ×3 IMPLANT
TRAY FOLEY W/METER SILVER 14FR (SET/KITS/TRAYS/PACK) IMPLANT
WATER STERILE IRR 1000ML POUR (IV SOLUTION) IMPLANT

## 2017-04-08 NOTE — Anesthesia Preprocedure Evaluation (Addendum)
Anesthesia Evaluation  Patient identified by MRN, date of birth, ID band Patient awake    Reviewed: Allergy & Precautions, H&P , NPO status , Patient's Chart, lab work & pertinent test results  Airway Mallampati: II  TM Distance: >3 FB Neck ROM: Full    Dental no notable dental hx. (+) Poor Dentition, Dental Advisory Given   Pulmonary Current Smoker,    Pulmonary exam normal breath sounds clear to auscultation       Cardiovascular negative cardio ROS   Rhythm:Regular Rate:Normal     Neuro/Psych negative neurological ROS  negative psych ROS   GI/Hepatic negative GI ROS, (+)     substance abuse  alcohol use,   Endo/Other  negative endocrine ROS  Renal/GU negative Renal ROS  negative genitourinary   Musculoskeletal   Abdominal   Peds  Hematology negative hematology ROS (+)   Anesthesia Other Findings   Reproductive/Obstetrics negative OB ROS                            Anesthesia Physical Anesthesia Plan  ASA: II  Anesthesia Plan: General   Post-op Pain Management:  Regional for Post-op pain   Induction: Intravenous  Airway Management Planned: Oral ETT  Additional Equipment:   Intra-op Plan:   Post-operative Plan: Extubation in OR  Informed Consent: I have reviewed the patients History and Physical, chart, labs and discussed the procedure including the risks, benefits and alternatives for the proposed anesthesia with the patient or authorized representative who has indicated his/her understanding and acceptance.   Dental advisory given  Plan Discussed with: CRNA  Anesthesia Plan Comments:         Anesthesia Quick Evaluation

## 2017-04-08 NOTE — Op Note (Signed)
Russell FellingKeith Colonna [id number] 06/12/1956   Open Repair of Incarcerated Right Indirect Inguinal Hernia Repair with Mesh Procedure Note  Indications: The patient presented with a history of a right, not reducible hernia.  Please see chart additional details. I discussed with the patient preoperatively that he was at higher risk for recurrence because his significant tobacco use as well as the fact that this is a large incarcerated inguinal hernia.   Pre-operative Diagnosis: right not reducible inguinal hernia  Post-operative Diagnosis: right indirect incarcerated inguinal hernia  Surgeon: Atilano InaWILSON,Symphoni Helbling M   Assistants: Magnus IvanSheila Bowman RNFA  Anesthesia: General endotracheal anesthesia and right TAPP block   Surgeon: Mary SellaEric M. Andrey CampanileWilson, MD, FACS  Procedure Details  The patient was seen again in the Holding Room. The risks, benefits, complications, treatment options, and expected outcomes were discussed with the patient. The possibilities of reaction to medication, pulmonary aspiration, perforation of viscus, bleeding, recurrent infection, the need for additional procedures, and development of a complication requiring transfusion or further operation were discussed with the patient and/or family. The likelihood of success in repairing the hernia and returning the patient to their previous functional status is good.  There was concurrence with the proposed plan, and informed consent was obtained. The site of surgery was properly noted/marked. The patient was taken to the Operating Room, identified as Russell FellingKeith Deupree, and the procedure verified as right inguinal hernia repair. A Time Out was held and the above information confirmed.  The patient was placed in the supine position and underwent induction of anesthesia. The lower abdomen and groin was prepped with Chloraprep and draped in the standard fashion, and 0.5% Marcaine with epinephrine was used to anesthetize the skin over the mid-portion of the inguinal  canal. An oblique incision was made. Dissection was carried down through the subcutaneous tissue with cautery to the external oblique fascia.  We opened the external oblique fascia along the direction of its fibers to the external ring.  He had a very large bulky incarcerated inguinal hernia. I was able to reduce the majority of this at this time. I was then able to circumferentially get around the remaining hernia contents including spermatic cord and retracted with a Penrose drain.  The floor of the inguinal canal was inspected there is no evidence of a direct defect.  We skeletonized the spermatic cord and isolated the hernia sac away from the surrounding cord and testicular vessels. Cremasterics fibers were taken down with electrocautery. The bulky hernia sac was then reduced into the abdominal cavity..  We used a 3 x 6 inch piece of Ultrapro mesh, which was cut into a keyhole shape.  This was secured with 2-0 Prolene, beginning at the pubic tubercle, running this along the shelving edge inferiorly. Superiorly, the mesh was secured to the internal oblique fascia with interrupted 2-0 Prolene sutures.  The tails of the mesh were sutured together behind the spermatic cord.  The mesh was tucked underneath the external oblique fascia laterally.  The wound was then irrigated with antibiotic irrigation. The external oblique fascia was reapproximated with 2-0 Vicryl.  3-0 Vicryl was used to close the subcutaneous tissues and 4-0 Monocryl was used to close the skin in subcuticular fashion.  Benzoin and steri-strips were used to seal the incision.  A clean dressing was applied.  The patient was then extubated and brought to the recovery room in stable condition.  All sponge, instrument, and needle counts were correct prior to closure and at the conclusion of the case.   Estimated  Blood Loss: Minimal                 Complications: None; patient tolerated the procedure well.         Disposition: PACU -  hemodynamically stable.         Condition: stable  Mary Sella. Andrey Campanile, MD, FACS General, Bariatric, & Minimally Invasive Surgery [facility], [place]

## 2017-04-08 NOTE — Transfer of Care (Signed)
Immediate Anesthesia Transfer of Care Note  Patient: Russell [name]  Procedure(s) Performed: Procedure(s): RIGHT HERNIA REPAIR INGUINAL INCARCERATED (Right) INSERTION OF MESH (Right)  Patient Location: PACU  Anesthesia Type:General  Level of Consciousness: awake, alert , oriented and patient cooperative  Airway & Oxygen Therapy: Patient Spontanous Breathing  Post-op Assessment: Report given to RN and Post -op Vital signs reviewed and stable  Post vital signs: Reviewed and stable  Last Vitals:  Vitals:   04/08/17 1120 04/08/17 1125  BP:    Pulse: 67 75  Resp: 11 18  Temp:      Last Pain:  Vitals:   04/08/17 0847  TempSrc:   PainSc: 8          Complications: No apparent anesthesia complications

## 2017-04-08 NOTE — Interval H&P Note (Signed)
History and Physical Interval Note:  04/08/2017 11:02 AM  Russell FellingKeith [name]  has presented today for surgery, with the diagnosis of incarcerated right iguinal hernia  The various methods of treatment have been discussed with the patient and family. After consideration of risks, benefits and other options for treatment, the patient has consented to  Procedure(s): RIGHT HERNIA REPAIR INGUINAL INCARCERATED (Right) INSERTION OF MESH (Right) as a surgical intervention .  The patient's history has been reviewed, patient examined, no change in status, stable for surgery.  I have reviewed the patient's chart and labs.  Questions were answered to the patient's satisfaction.    Mary SellaEric [name]. Andrey CampanileWilson, MD, FACS General, Bariatric, & Minimally Invasive Surgery [facility] [place]   [facility] Hospital[facility]ILSON,Russell [name]

## 2017-04-08 NOTE — Anesthesia Procedure Notes (Signed)
Procedure Name: Intubation Date/Time: 04/08/2017 11:55 AM Performed by: Oletta Lamas Pre-anesthesia Checklist: Patient identified, Emergency Drugs available, Suction available, Patient being monitored and Timeout performed Patient Re-evaluated:Patient Re-evaluated prior to inductionOxygen Delivery Method: Circle system utilized Preoxygenation: Pre-oxygenation with 100% oxygen Intubation Type: IV induction Ventilation: Two handed mask ventilation required Laryngoscope Size: Mac and 4 Grade View: Grade III Tube type: Oral Tube size: 7.5 mm Number of attempts: 2 Airway Equipment and Method: Stylet Placement Confirmation: ETT inserted through vocal cords under direct vision,  positive ETCO2 and breath sounds checked- equal and bilateral Secured at: 23 cm Tube secured with: Tape Dental Injury: Teeth and Oropharynx as per pre-operative assessment  Comments: Attempt x1 by paramedic student, successful attempt by CRNA

## 2017-04-08 NOTE — Care Management Note (Signed)
Case Management Note  Patient Details  Name: Russell [name] MRN: [id number] Date of Birth: 09/10/1956  Subjective/Objective:                    Action/Plan:  Consult for Patient is homeless and has no insurance. Will be undergoing a significant surgery. Will need any support we can offer.  [facility] day of discharge [facility]. Will continue to follow. Expected Discharge Date:                  Expected Discharge Plan:     In-House Referral:  Clinical Social Work, Museum/gallery exhibitions officerinancial Counselor  Discharge planning Services  CM Consult, Indigent Health Clinic, MATCH Program, Medication Assistance  Post Acute Care Choice:    Choice offered to:     DME Arranged:    DME Agency:     HH Arranged:    HH Agency:     Status of Service:  In process, will continue to follow  If discussed at Long Length of Stay Meetings, dates discussed:    Additional Comments:  Kingsley PlanWile, [place] Zou Marie, RN 04/08/2017, 10:11 AM

## 2017-04-08 NOTE — Clinical Social Work Note (Signed)
Clinical Social Work Assessment  Patient Details  Name: Russell [name] MRN: [id number] Date of Birth: 19-May-1956  Date of referral:  04/08/17               Reason for consult:  Housing Concerns/Homelessness                Permission sought to share information with:    Permission granted to share information::     Name::        Agency::     Relationship::     Contact Information:     Housing/Transportation Living arrangements for the past 2 months:  Hotel/Motel Lobbyist) Source of Information:  Patient Patient Interpreter Needed:  None Criminal Activity/Legal Involvement Pertinent to Current Situation/Hospitalization:  No - Comment as needed Significant Relationships:  Significant Other Lives with:  Significant Other Do you feel safe going back to the place where you live?  Yes Need for family participation in patient care:  No (Coment)  Care giving concerns:  No care giving concerns identified.    Social Worker assessment / plan:  CSW met with pt at bedside to address consult for "homeless issues." CSW introduced herself and explained role of social work. Pt was living at the [facility] with his girlfriend, but stated he has no money to return. Pt stated he is "unable to go out and make money" in his condition. CSW asked pt's source of income, but pt did not respond.   CSW provided and discussed shelter listing. Pt displeased with shelter list and stated he does not want to stay there. Pt stated that when he was in another hospital they discharged him to a hotel. CSW explained unfortunately that is not an option. Pt asked if his girlfriend would be able to stay with him at the shelter. CSW explained women have separate sleeping quarters, but are in the same building. Pt agreed to take the packet just in case he needed it later.   CSW is signing off as no further social work needs identified.   Employment status:  Other (Comment) (Pt did not disclose) Insurance information:   Self Pay (Medicaid Pending) PT Recommendations:  Not assessed at this time Information / Referral to community resources:  Shelter  Patient/Family's Response to care:  Pt frustrated that unable to be placed in a hotel.   Patient/Family's Understanding of and Emotional Response to Diagnosis, Current Treatment, and Prognosis:  Pt appears to understand his diagnosis, prognosis, and current treatment.   Emotional Assessment Appearance:  Appears stated age Attitude/Demeanor/Rapport:  Complaining, Angry Affect (typically observed):  Agitated Orientation:  Oriented to Self, Oriented to Place, Oriented to  Time, Oriented to Situation Alcohol / Substance use:  Other Psych involvement (Current and /or in the community):  No (Comment)  Discharge Needs  Concerns to be addressed:  Homelessness Readmission within the last 30 days:  No Current discharge risk:  Homeless Barriers to Discharge:  Continued Medical Work up   [employer], LCSW 04/08/2017, 5:45 PM

## 2017-04-08 NOTE — Anesthesia Procedure Notes (Signed)
Anesthesia Regional Block: TAP block   Pre-Anesthetic Checklist: ,, timeout performed, Correct Patient, Correct Site, Correct Laterality, Correct Procedure, Correct Position, site marked, Risks and benefits discussed, pre-op evaluation,  At surgeon's request and post-op pain management  Laterality: Right  Prep: Maximum Sterile Barrier Precautions used, chloraprep       Needles:  Injection technique: Single-shot  Needle Type: Echogenic Stimulator Needle     Needle Length: 9cm  Needle Gauge: 21     Additional Needles:   Procedures: ultrasound guided,,,,,,,,  Narrative:  Start time: 04/08/2017 11:10 AM End time: 04/08/2017 11:20 AM Injection made incrementally with aspirations every 5 mL. Anesthesiologist: Gaynelle AduFITZGERALD, Crescentia Boutwell  Additional Notes: 2% Lidocaine skin wheel.

## 2017-04-09 ENCOUNTER — Encounter (HOSPITAL_COMMUNITY): Payer: Self-pay | Admitting: General Surgery

## 2017-04-09 LAB — BASIC METABOLIC PANEL
ANION GAP: 9 (ref 5–15)
BUN: 6 mg/dL (ref 6–20)
CHLORIDE: 100 mmol/L — AB (ref 101–111)
CO2: 25 mmol/L (ref 22–32)
Calcium: 9 mg/dL (ref 8.9–10.3)
Creatinine, Ser: 0.89 mg/dL (ref 0.61–1.24)
GFR calc non Af Amer: >60 mL/min (ref >60)
Glucose, Bld: 91 mg/dL (ref 65–99)
Potassium: 3.8 mmol/L (ref 3.5–5.1)
Sodium: 134 mmol/L — ABNORMAL LOW (ref 135–145)

## 2017-04-09 LAB — CBC
HEMATOCRIT: 42.1 % (ref 39.0–52.0)
Hemoglobin: 14 g/dL (ref 13.0–17.0)
MCH: 32.2 pg (ref 26.0–34.0)
MCHC: 33.3 g/dL (ref 30.0–36.0)
MCV: 96.8 fL (ref 78.0–100.0)
Platelets: 329 10*3/uL (ref 150–400)
RBC: 4.35 MIL/uL (ref 4.22–5.81)
RDW: 11.5 % (ref 11.5–15.5)
WBC: 12.4 10*3/uL — AB (ref 4.0–10.5)

## 2017-04-09 MED ORDER — KETOROLAC TROMETHAMINE 15 MG/ML IJ SOLN
15.0000 mg | Freq: Four times a day (QID) | INTRAMUSCULAR | Status: DC
  Administered 2017-04-09 – 2017-04-13 (×15): 15 mg via INTRAVENOUS
  Filled 2017-04-09 (×16): qty 1

## 2017-04-09 MED ORDER — MORPHINE SULFATE (PF) 4 MG/ML IV SOLN
1.0000 mg | INTRAVENOUS | Status: DC | PRN
  Administered 2017-04-09 – 2017-04-10 (×6): 4 mg via INTRAVENOUS
  Filled 2017-04-09 (×6): qty 1

## 2017-04-09 MED ORDER — PANTOPRAZOLE SODIUM 40 MG PO TBEC
40.0000 mg | DELAYED_RELEASE_TABLET | Freq: Every day | ORAL | Status: DC
  Administered 2017-04-09 – 2017-04-12 (×3): 40 mg via ORAL
  Filled 2017-04-09 (×4): qty 1

## 2017-04-09 MED ORDER — METHOCARBAMOL 500 MG PO TABS
500.0000 mg | ORAL_TABLET | Freq: Three times a day (TID) | ORAL | Status: DC
  Administered 2017-04-09 – 2017-04-10 (×4): 500 mg via ORAL
  Filled 2017-04-09 (×4): qty 1

## 2017-04-09 NOTE — Anesthesia Postprocedure Evaluation (Signed)
Anesthesia Post Note  Patient: Russell [name]  Procedure(s) Performed: Procedure(s) (LRB): RIGHT HERNIA REPAIR INGUINAL INCARCERATED (Right) INSERTION OF MESH (Right)     Patient location during evaluation: PACU Anesthesia Type: General and Regional Level of consciousness: awake and alert Pain management: pain level controlled Vital Signs Assessment: post-procedure vital signs reviewed and stable Respiratory status: spontaneous breathing, nonlabored ventilation and respiratory function stable Cardiovascular status: blood pressure returned to baseline and stable Postop Assessment: no signs of nausea or vomiting Anesthetic complications: no    Last Vitals:  Vitals:   04/09/17 0406 04/09/17 1352  BP: (!) 152/81 (!) 144/69  Pulse: 89 88  Resp: 18 19  Temp: 36.6 C 37.4 C    Last Pain:  Vitals:   04/09/17 1710  TempSrc:   PainSc: Asleep                 [name],W. EDMOND

## 2017-04-09 NOTE — Discharge Instructions (Addendum)
[facility] _______[facility] ° °INGUINAL HERNIA REPAIR: POST OP INSTRUCTIONS ° °Always review your discharge instruction sheet given to you by the facility where your surgery was performed. °IF YOU HAVE DISABILITY OR FAMILY LEAVE FORMS, YOU MUST BRING THEM TO THE OFFICE FOR PROCESSING.   °DO NOT GIVE THEM TO YOUR DOCTOR. ° °1. A  prescription for pain medication may be given to you upon discharge.  Take your pain medication as prescribed, if needed.  If narcotic pain medicine is not needed, then you may take acetaminophen (Tylenol) or ibuprofen (Advil) as needed. °2. Take your usually prescribed medications unless otherwise directed. °If you need a refill on your pain medication, please contact your pharmacy.  They will contact our office to request authorization. Prescriptions will not be filled after 5 pm or on week-ends. °3. You should follow a light diet the first 24 hours after arrival home, such as soup and crackers, etc.  Be sure to include lots of fluids daily.  Resume your normal diet the day after surgery. °4.Most patients will experience some swelling and bruising around the umbilicus or in the groin and scrotum.  Ice packs and reclining will help.  Swelling and bruising can take several days to resolve.  °6. It is common to experience some constipation if taking pain medication after surgery.  Increasing fluid intake and taking a stool softener (such as Colace) will usually help or prevent this problem from occurring.  A mild laxative (Milk of Magnesia or Miralax) should be taken according to package directions if there are no bowel movements after 48 hours. °7. Unless discharge instructions indicate otherwise, you may remove your bandages 24-48 hours after surgery, and you may shower at that time.  You may have steri-strips (small skin tapes) in place directly over the incision.  These strips should be left on the skin for 7-10 days.  If your surgeon used skin glue on the incision, you may  shower in 24 hours.  The glue will flake off over the next 2-3 weeks.  Any sutures or staples will be removed at the office during your follow-up visit. °8. ACTIVITIES:  You may resume regular (light) daily activities beginning the next day--such as daily self-care, walking, climbing stairs--gradually increasing activities as tolerated.  You may have sexual intercourse when it is comfortable.  Refrain from any heavy lifting or straining until approved by your doctor. ° °a.You may drive when you are no longer taking prescription pain medication, you can comfortably wear a seatbelt, and you can safely maneuver your car and apply brakes. ° °9.You should see your doctor in the office for a follow-up appointment approximately 2-3 weeks after your surgery.  Make sure that you call for this appointment within a day or two after you arrive home to insure a convenient appointment time. °10.OTHER INSTRUCTIONS: _________________________ °   _____________________________________ ° °WHEN TO CALL YOUR DOCTOR: °1. Fever over 101.0 °2. Inability to urinate °3. Nausea and/or vomiting °4. Extreme swelling or bruising °5. Continued bleeding from incision. °6. Increased pain, redness, or drainage from the incision ° °The clinic staff is available to answer your questions during regular business hours.  Please don’t hesitate to call and ask to speak to one of the nurses for clinical concerns.  If you have a medical emergency, go to the nearest emergency room or call 911.  A surgeon from [facility] is always on call at the hospital ° ° °[address], [address], [place], [place]    [address] ? ° [address] [address], [place], [place]   [address] °[phone] ? [phone] ? FAX [phone] °Web site: [email or web address] ° °

## 2017-04-09 NOTE — Progress Notes (Signed)
Patient ID: Russell [name], male   DOB: 10/07/1956, 61 y.o.   MRN: [id number]  [facility]  1 Day Post-Op  Subjective: CC- groin pain Patient states that he is tolerating liquids, but has not had any solid food since surgery. No flatus or BM. Denies n/v. States that he is in a lot of pain. Morphine and oxycodone not helping.  Objective: Vital signs in last 24 hours: Temp:  [97.2 F (36.2 C)-99.1 F (37.3 C)] 97.9 F (36.6 C) (05/31 0406) Pulse Rate:  [67-93] 89 (05/31 0406) Resp:  [11-21] 18 (05/31 0406) BP: (128-170)/(75-104) 152/81 (05/31 0406) SpO2:  [92 %-99 %] 97 % (05/31 0406) Last BM Date: 04/07/17  Intake/Output from previous day: 05/30 0701 - 05/31 0700 In: 2470.8 [P.O.:600; I.V.:1870.8] Out: 750 [Urine:700; Blood:50] Intake/Output this shift: No intake/output data recorded.  PE: Gen:  Alert, NAD, pleasant Card:  RRR, no M/G/R heard Pulm:  CTAB, no W/R/R, effort normal Abd: Soft, distended, hypoactive BS, right inguinal incision with clean dry dressing in place, moderate swelling in scrotum Ext:  No erythema, edema, or tenderness BUE/BLE   Lab Results:   Recent Labs  04/07/17 0845 04/09/17 0440  WBC 8.6 12.4*  HGB 13.0 14.0  HCT 39.3 42.1  PLT 304 329   BMET  Recent Labs  04/07/17 0845 04/09/17 0440  NA 137 134*  K 3.8 3.8  CL 107 100*  CO2 23 25  GLUCOSE 105* 91  BUN 11 6  CREATININE 0.78 0.89  CALCIUM 8.6* 9.0   PT/INR No results for input(s): LABPROT, INR in the last 72 hours. CMP     Component Value Date/Time   NA 134 (L) 04/09/2017 0440   K 3.8 04/09/2017 0440   CL 100 (L) 04/09/2017 0440   CO2 25 04/09/2017 0440   GLUCOSE 91 04/09/2017 0440   BUN 6 04/09/2017 0440   CREATININE 0.89 04/09/2017 0440   CALCIUM 9.0 04/09/2017 0440   PROT 7.0 04/07/2017 0845   ALBUMIN 3.3 (L) 04/07/2017 0845   AST 31 04/07/2017 0845   ALT 27 04/07/2017 0845   ALKPHOS 79 04/07/2017 0845   BILITOT 0.6 04/07/2017 0845    GFRNONAA >60 04/09/2017 0440   GFRAA >60 04/09/2017 0440   Lipase     Component Value Date/Time   LIPASE 30 06/20/2011 2020       Studies/Results: Ct Abdomen Pelvis W Contrast  Result Date: 04/07/2017 CLINICAL DATA:  Right inguinal region pain and swelling EXAM: CT ABDOMEN AND PELVIS WITH CONTRAST TECHNIQUE: Multidetector CT imaging of the abdomen and pelvis was performed using the standard protocol following bolus administration of intravenous contrast. CONTRAST:  100mL ISOVUE-300 IOPAMIDOL (ISOVUE-300) INJECTION 61% COMPARISON:  None. FINDINGS: Lower chest: There is mild bibasilar atelectatic change. Lung bases otherwise are clear. There is a small hiatal hernia. Hepatobiliary: No focal liver lesions are evident. Gallbladder wall is not appreciably thickened. There is no biliary duct dilatation. Pancreas: No pancreatic mass or inflammatory focus. Spleen: No splenic lesions are evident. Adrenals/Urinary Tract: Adrenals appear normal bilaterally. There is a 6 mm cyst in the lateral mid left kidney. There is no hydronephrosis on either side. There is no renal or ureteral calculus on either side. Urinary bladder is midline with wall thickness within normal limits. Stomach/Bowel: There is a sizable right inguinal hernia which extends to the level of the scrotum on the right which contains loops of small and large bowel. There is no bowel compromise. There is moderate stool throughout the  colon. There is no appreciable bowel wall or mesenteric thickening. There is no bowel obstruction. No free air or portal venous air evident. Vascular/Lymphatic: There is atherosclerotic calcification in the aorta and iliac arteries. There is no evident abdominal aortic aneurysm. Major mesenteric vessels appear patent. There is no evident adenopathy in the abdomen or pelvis. Reproductive: Prostate and seminal vesicles are normal in size and contour. There are several small prostatic calculi. Other: As noted above, there  is a sizable right inguinal hernia which contains loops of small and large bowel but no bowel compromise. This hernia extends to the level of the right scrotum. Visualized portions of the right testis appear unremarkable by CT. There is a moderate left inguinal hernia containing only fat. There is a focal ventral hernia which likewise contains only fat located at the level of the umbilicus. A second ventral hernia midline contains only fat, located approximately 9 cm proximal to the umbilicus. Appendix appears normal. No abscess or ascites is evident in the abdomen or pelvis. Musculoskeletal: There is degenerative change throughout the lumbar spine. There are no blastic or lytic bone lesions. There are no intramuscular lesions evident. IMPRESSION: Right inguinal hernia containing loops of small and large bowel without bowel compromise. Hernia extends to the level of the right scrotum. Left inguinal hernia containing only fat. There also midline ventral hernias containing only fat at the level of the umbilicus as well as approximately 9 cm proximal to the umbilicus. There is a small hiatal hernia as well. Appendix appears normal. No bowel obstruction or bowel wall thickening. No abscess. No renal or ureteral calculus.  No hydronephrosis. There is aortoiliac atherosclerosis. There is multifocal degenerative change in the lumbar spine. Electronically Signed   By: Bretta Bang III M.D.   On: 04/07/2017 10:10   Dg Knee Complete 4 Views Right  Result Date: 04/07/2017 CLINICAL DATA:  Status post fall yesterday with pain in the anterior aspect of the knee EXAM: RIGHT KNEE - COMPLETE 4+ VIEW COMPARISON:  None in PACs FINDINGS: The bones are subjectively adequately mineralized. There is no acute fracture nor dislocation. The joint spaces are reasonably well-maintained. There is beaking of the tibial spines. There are calcifications which project at the level of the tibial spines which may reflect intra-articular  loose bodies. Spurs arise from the articular margins of the patella. There is a small amount of prepatellar soft tissue swelling. There is no definite joint effusion. IMPRESSION: No acute fracture nor dislocation. 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 Probable intra-articular loose bodies adjacent to the medial tibial spine. Electronically Signed   By: David  [place] M.D.   On: 04/07/2017 09:12    Anti-infectives: Anti-infectives    Start     Dose/Rate Route Frequency Ordered Stop   04/08/17 1257  polymyxin B 500,000 Units, bacitracin 50,000 Units in sodium chloride irrigation 0.9 % 500 mL irrigation  Status:  Discontinued       As needed 04/08/17 1257 04/08/17 1348   04/08/17 1040  cefoTEtan in Dextrose 5% (CEFOTAN) 2-2.08 GM-% IVPB    Comments:  Lorenda Ishihara   : cabinet override      04/08/17 1040 04/08/17 2244   04/08/17 0600  cefoTEtan (CEFOTAN) 2 g in dextrose 5 % 50 mL IVPB  Status:  Discontinued     2 g 100 mL/hr over 30 Minutes Intravenous On call to O.R. 04/07/17 1556 04/08/17 1133       Assessment/Plan Right indirect incarcerated inguinal  hernia S/p open repair of incarcerated right indirect inguinal hernia repair with mesh 5/30 Dr. Andrey Campanile - POD 1  ETOH abuse - CIWA protocol Tobacco abuse Homeless - appreciate social work consult  ID - cefotetan perioperative VTE - SCDs, lovenox FEN - regular diet  Plan - add toradol and robaxin for better pain control. Ice frequently throughout the day. Please ambulate with patient. Needs to work on pain control and PO intake before he is ready for discharge.    LOS: 2 days    Edson Snowball , [facility] 04/09/2017, 8:12 AM Pager: [phone] Consults: [phone] Mon-Fri 7:00 am-4:30 pm Sat-Sun 7:00 am-11:30 am

## 2017-04-10 ENCOUNTER — Encounter (HOSPITAL_COMMUNITY): Payer: Self-pay | Admitting: General Surgery

## 2017-04-10 LAB — CBC
HEMATOCRIT: 38.2 % — AB (ref 39.0–52.0)
HEMOGLOBIN: 12.8 g/dL — AB (ref 13.0–17.0)
MCH: 32.4 pg (ref 26.0–34.0)
MCHC: 33.5 g/dL (ref 30.0–36.0)
MCV: 96.7 fL (ref 78.0–100.0)
PLATELETS: 284 10*3/uL (ref 150–400)
RBC: 3.95 MIL/uL — AB (ref 4.22–5.81)
RDW: 11.5 % (ref 11.5–15.5)
WBC: 13.5 10*3/uL — AB (ref 4.0–10.5)

## 2017-04-10 LAB — URINALYSIS, ROUTINE W REFLEX MICROSCOPIC
Glucose, UA: NEGATIVE mg/dL
Hgb urine dipstick: NEGATIVE
KETONES UR: NEGATIVE mg/dL
Leukocytes, UA: NEGATIVE
NITRITE: NEGATIVE
PH: 5 (ref 5.0–8.0)
Protein, ur: NEGATIVE mg/dL
Specific Gravity, Urine: 1.035 — ABNORMAL HIGH (ref 1.005–1.030)

## 2017-04-10 LAB — BASIC METABOLIC PANEL
ANION GAP: 8 (ref 5–15)
BUN: 15 mg/dL (ref 6–20)
CHLORIDE: 104 mmol/L (ref 101–111)
CO2: 25 mmol/L (ref 22–32)
Calcium: 8.7 mg/dL — ABNORMAL LOW (ref 8.9–10.3)
Creatinine, Ser: 0.79 mg/dL (ref 0.61–1.24)
GFR calc Af Amer: >60 mL/min (ref >60)
GLUCOSE: 119 mg/dL — AB (ref 65–99)
Potassium: 3.8 mmol/L (ref 3.5–5.1)
Sodium: 137 mmol/L (ref 135–145)

## 2017-04-10 MED ORDER — MORPHINE SULFATE (PF) 4 MG/ML IV SOLN
1.0000 mg | Freq: Three times a day (TID) | INTRAVENOUS | Status: DC | PRN
  Administered 2017-04-10 – 2017-04-12 (×5): 4 mg via INTRAVENOUS
  Filled 2017-04-10 (×7): qty 1

## 2017-04-10 MED ORDER — ACETAMINOPHEN 325 MG PO TABS
650.0000 mg | ORAL_TABLET | Freq: Four times a day (QID) | ORAL | Status: DC
  Filled 2017-04-10 (×2): qty 2

## 2017-04-10 MED ORDER — ACETAMINOPHEN 650 MG RE SUPP: 650.0000 mg | Freq: Four times a day (QID) | RECTAL | Status: DC

## 2017-04-10 MED ORDER — POLYETHYLENE GLYCOL 3350 17 G PO PACK
17.0000 g | PACK | Freq: Every day | ORAL | Status: DC
  Filled 2017-04-10: qty 1

## 2017-04-10 MED ORDER — METHOCARBAMOL 500 MG PO TABS
1000.0000 mg | ORAL_TABLET | Freq: Three times a day (TID) | ORAL | Status: DC
  Administered 2017-04-10 – 2017-04-11 (×3): 1000 mg via ORAL
  Filled 2017-04-10 (×5): qty 2

## 2017-04-10 NOTE — Progress Notes (Signed)
Patient ID: Russell [name], male   DOB: 06/23/1956, 61 y.o.   MRN: [id number]  [facility]  2 Days Post-Op  Subjective: CC- groin pain Patient states that he is still in severe pain. Pain is constant but worse with movement. Morphine and toradol help a little. Not currently icing but has been using intermittently throughout the day. Tolerating regular diet. Passing flatus, no BM. Patient also reports difficulty urinating. He is making urine, but states that it is difficult to start and he is having some burning.  Objective: Vital signs in last 24 hours: Temp:  [99.3 F (37.4 C)-99.5 F (37.5 C)] 99.3 F (37.4 C) (06/01 0440) Pulse Rate:  [88-91] 91 (06/01 0440) Resp:  [18-19] 19 (06/01 0440) BP: (142-153)/(67-81) 153/81 (06/01 0440) SpO2:  [93 %-96 %] 94 % (06/01 0440) Last BM Date: 04/07/17  Intake/Output from previous day: 05/31 0701 - 06/01 0700 In: 1429.5 [P.O.:632; I.V.:797.5] Out: 670 [Urine:670] Intake/Output this shift: No intake/output data recorded.  PE: Gen:  Alert, NAD, pleasant Card:  RRR, no M/G/R heard Pulm:  CTAB, no W/R/R, effort normal Abd: Soft, distended, few BS heard, right inguinal incision cdi with steris in place, moderate swelling in scrotum Ext:  No erythema, edema, or tenderness BUE/BLE   Lab Results:   Recent Labs  04/09/17 0440 04/10/17 0628  WBC 12.4* 13.5*  HGB 14.0 12.8*  HCT 42.1 38.2*  PLT 329 284   BMET  Recent Labs  04/09/17 0440 04/10/17 0628  NA 134* 137  K 3.8 3.8  CL 100* 104  CO2 25 25  GLUCOSE 91 119*  BUN 6 15  CREATININE 0.89 0.79  CALCIUM 9.0 8.7*   PT/INR No results for input(s): LABPROT, INR in the last 72 hours. CMP     Component Value Date/Time   NA 137 04/10/2017 0628   K 3.8 04/10/2017 0628   CL 104 04/10/2017 0628   CO2 25 04/10/2017 0628   GLUCOSE 119 (H) 04/10/2017 0628   BUN 15 04/10/2017 0628   CREATININE 0.79 04/10/2017 0628   CALCIUM 8.7 (L) 04/10/2017 0628    PROT 7.0 04/07/2017 0845   ALBUMIN 3.3 (L) 04/07/2017 0845   AST 31 04/07/2017 0845   ALT 27 04/07/2017 0845   ALKPHOS 79 04/07/2017 0845   BILITOT 0.6 04/07/2017 0845   GFRNONAA >60 04/10/2017 0628   GFRAA >60 04/10/2017 0628   Lipase     Component Value Date/Time   LIPASE 30 06/20/2011 2020       Studies/Results: No results found.  Anti-infectives: Anti-infectives    Start     Dose/Rate Route Frequency Ordered Stop   04/08/17 1257  polymyxin B 500,000 Units, bacitracin 50,000 Units in sodium chloride irrigation 0.9 % 500 mL irrigation  Status:  Discontinued       As needed 04/08/17 1257 04/08/17 1348   04/08/17 1040  cefoTEtan in Dextrose 5% (CEFOTAN) 2-2.08 GM-% IVPB    Comments:  Lorenda IshiharaGibbs, Bonnie   : cabinet override      04/08/17 1040 04/08/17 2244   04/08/17 0600  cefoTEtan (CEFOTAN) 2 g in dextrose 5 % 50 mL IVPB  Status:  Discontinued     2 g 100 mL/hr over 30 Minutes Intravenous On call to O.R. 04/07/17 1556 04/08/17 1133       Assessment/Plan Right indirect incarcerated inguinal hernia S/p open repair of incarcerated right indirect inguinal hernia repair with mesh 5/30 Dr. Andrey CampanileWilson  - POD 2  ETOH abuse- CIWA protocol  Tobacco abuse Homeless   ID - cefotetan perioperative VTE - SCDs, lovenox FEN - regular diet, add daily miralax  Plan - increase robaxin and add tylenol to see if this helps with pain; trying to limit IV narcotics.   Continue to ice frequently.  Patient urinating but states that it is difficult, and WBC slightly up today; check u/a.  Continue to ambulate with patient. Encourage IS.   LOS: 3 days    Edson Snowball , [facility] 04/10/2017, 9:58 AM Pager: [phone] Consults: [phone] Mon-Fri 7:00 am-4:30 pm Sat-Sun 7:00 am-11:30 am  Agree with above. His issue is that he is homeless - and has no support system. He is doing well from the surgery.  Ovidio Kin, MD, [facility] Pager:  [phone] Office phone:  [phone]

## 2017-04-10 NOTE — OR Nursing (Signed)
Late entry due to delay documentation. 

## 2017-04-11 LAB — CBC
HCT: 36.1 % — ABNORMAL LOW (ref 39.0–52.0)
Hemoglobin: 11.8 g/dL — ABNORMAL LOW (ref 13.0–17.0)
MCH: 32.3 pg (ref 26.0–34.0)
MCHC: 32.7 g/dL (ref 30.0–36.0)
MCV: 98.9 fL (ref 78.0–100.0)
PLATELETS: 308 10*3/uL (ref 150–400)
RBC: 3.65 MIL/uL — AB (ref 4.22–5.81)
RDW: 11.6 % (ref 11.5–15.5)
WBC: 11.1 10*3/uL — ABNORMAL HIGH (ref 4.0–10.5)

## 2017-04-11 LAB — URINE CULTURE: Culture: NO GROWTH

## 2017-04-11 MED ORDER — OXYCODONE-ACETAMINOPHEN 5-325 MG PO TABS
1.0000 | ORAL_TABLET | ORAL | Status: DC | PRN
  Administered 2017-04-11 – 2017-04-13 (×8): 1 via ORAL
  Filled 2017-04-11 (×8): qty 1

## 2017-04-11 MED ORDER — LORAZEPAM 1 MG PO TABS
1.0000 mg | ORAL_TABLET | Freq: Four times a day (QID) | ORAL | Status: DC | PRN
  Administered 2017-04-11 – 2017-04-13 (×5): 1 mg via ORAL
  Filled 2017-04-11 (×5): qty 1

## 2017-04-11 MED ORDER — NICOTINE 21 MG/24HR TD PT24
21.0000 mg | MEDICATED_PATCH | Freq: Every day | TRANSDERMAL | Status: DC
  Administered 2017-04-11 – 2017-04-12 (×2): 21 mg via TRANSDERMAL
  Filled 2017-04-11 (×2): qty 1

## 2017-04-11 NOTE — Progress Notes (Signed)
[facility] Progress Note:   3 Days Post-Op  Subjective: Mental status is alert.  Voicing concern about he size of his scrotum Objective: Vital signs in last 24 hours: Temp:  [98.2 F (36.8 C)-98.6 F (37 C)] 98.6 F (37 C) (06/02 0547) Pulse Rate:  [86-107] 86 (06/02 0547) Resp:  [19-20] 20 (06/02 0547) BP: (136-142)/(66-73) 142/66 (06/02 0547) SpO2:  [95 %-97 %] 95 % (06/02 0547)  Intake/Output from previous day: 06/01 0701 - 06/02 0700 In: 1496 [P.O.:570; I.V.:926] Out: 900 [Urine:900] Intake/Output this shift: No intake/output data recorded.  Physical Exam: Work of breathing is normal.  Transvere incision with steristrips.  Left uncovered.  Scrotum swollen without discoloration consistent with seroma.    Lab Results:  Results for orders placed or performed during the hospital encounter of 04/07/17 (from the past 48 hour(s))  CBC     Status: Abnormal   Collection Time: 04/10/17  6:28 AM  Result Value Ref Range   WBC 13.5 (H) 4.0 - 10.5 K/uL   RBC 3.95 (L) 4.22 - 5.81 MIL/uL   Hemoglobin 12.8 (L) 13.0 - 17.0 g/dL   HCT 38.2 (L) 39.0 - 52.0 %   MCV 96.7 78.0 - 100.0 fL   MCH 32.4 26.0 - 34.0 pg   MCHC 33.5 30.0 - 36.0 g/dL   RDW 11.5 11.5 - 15.5 %   Platelets 284 150 - 400 K/uL  Basic metabolic panel     Status: Abnormal   Collection Time: 04/10/17  6:28 AM  Result Value Ref Range   Sodium 137 135 - 145 mmol/L   Potassium 3.8 3.5 - 5.1 mmol/L   Chloride 104 101 - 111 mmol/L   CO2 25 22 - 32 mmol/L   Glucose, Bld 119 (H) 65 - 99 mg/dL   BUN 15 6 - 20 mg/dL   Creatinine, Ser 0.79 0.61 - 1.24 mg/dL   Calcium 8.7 (L) 8.9 - 10.3 mg/dL   GFR calc non Af Amer >60 >60 mL/min   GFR calc Af Amer >60 >60 mL/min    Comment: (NOTE) The eGFR has been calculated using the CKD EPI equation. This calculation has not been validated in all clinical situations. eGFR's persistently <60 mL/min signify possible Chronic Kidney Disease.    Anion gap 8 5 - 15  Urinalysis,  Routine w reflex microscopic     Status: Abnormal   Collection Time: 04/10/17  5:02 PM  Result Value Ref Range   Color, Urine AMBER (A) YELLOW    Comment: BIOCHEMICALS MAY BE AFFECTED BY COLOR   APPearance CLEAR CLEAR   Specific Gravity, Urine 1.035 (H) 1.005 - 1.030   pH 5.0 5.0 - 8.0   Glucose, UA NEGATIVE NEGATIVE mg/dL   Hgb urine dipstick NEGATIVE NEGATIVE   Bilirubin Urine SMALL (A) NEGATIVE   Ketones, ur NEGATIVE NEGATIVE mg/dL   Protein, ur NEGATIVE NEGATIVE mg/dL   Nitrite NEGATIVE NEGATIVE   Leukocytes, UA NEGATIVE NEGATIVE  CBC     Status: Abnormal   Collection Time: 04/11/17  5:26 AM  Result Value Ref Range   WBC 11.1 (H) 4.0 - 10.5 K/uL   RBC 3.65 (L) 4.22 - 5.81 MIL/uL   Hemoglobin 11.8 (L) 13.0 - 17.0 g/dL   HCT 36.1 (L) 39.0 - 52.0 %   MCV 98.9 78.0 - 100.0 fL   MCH 32.3 26.0 - 34.0 pg   MCHC 32.7 30.0 - 36.0 g/dL   RDW 11.6 11.5 - 15.5 %   Platelets 308 150 -  400 K/uL    Radiology/Results: No results found.  Anti-infectives: Anti-infectives    Start     Dose/Rate Route Frequency Ordered Stop   04/08/17 1257  polymyxin B 500,000 Units, bacitracin 50,000 Units in sodium chloride irrigation 0.9 % 500 mL irrigation  Status:  Discontinued       As needed 04/08/17 1257 04/08/17 1348   04/08/17 1040  cefoTEtan in Dextrose 5% (CEFOTAN) 2-2.08 GM-% IVPB    Comments:  Laurita Quint   : cabinet override      04/08/17 1040 04/08/17 2244   04/08/17 0600  cefoTEtan (CEFOTAN) 2 g in dextrose 5 % 50 mL IVPB  Status:  Discontinued     2 g 100 mL/hr over 30 Minutes Intravenous On call to O.R. 04/07/17 1556 04/08/17 1133      Assessment/Plan: Problem List: Patient Active Problem List   Diagnosis Date Noted  . Incarcerated right inguinal hernia 04/07/2017    Will get on oral pain meds. OK to shower and shampoo.  Scrotal support.  Hopeful discharge tomorrow 3 Days Post-Op    LOS: 4 days   Matt B. Hassell Done, MD, [facility] [phone]  beeper [phone]  04/11/2017 8:35 AM

## 2017-04-12 ENCOUNTER — Inpatient Hospital Stay (HOSPITAL_COMMUNITY): Payer: Self-pay

## 2017-04-12 NOTE — Progress Notes (Signed)
Patient ID: Russell [name], male   DOB: 09/02/1956, 61 y.o.   MRN: [id number] [facility] Progress [facility]:   4 Days Post-Op  Subjective: Mental status is alert and irritated.  He doesn't want to go home and his girlfriend wants him to go home.   Objective: Vital signs in last 24 hours: Temp:  [97.9 F (36.6 C)-98.2 F (36.8 C)] 97.9 F (36.6 C) (06/03 0551) Pulse Rate:  [70-74] 73 (06/03 0551) Resp:  [18] 18 (06/03 0551) BP: (134-143)/(66-71) 143/66 (06/03 0551) SpO2:  [97 %-99 %] 99 % (06/03 0551)  Intake/Output from previous day: 06/02 0701 - 06/03 0700 In: 1640 [P.O.:1040; I.V.:600] Out: 800 [Urine:800] Intake/Output this shift: No intake/output data recorded.  Physical Exam: Work of breathing is normal.  Right inguinal incision is bland and covered with steristrips.  Right scrotum is distended but not discolored  Lab Results:  Results for orders placed or performed during the hospital encounter of 04/07/17 (from the past 48 hour(s))  Urinalysis, Routine w reflex microscopic     Status: Abnormal   Collection Time: 04/10/17  5:02 PM  Result Value Ref Range   Color, Urine AMBER (A) YELLOW    Comment: BIOCHEMICALS MAY BE AFFECTED BY COLOR   APPearance CLEAR CLEAR   Specific Gravity, Urine 1.035 (H) 1.005 - 1.030   pH 5.0 5.0 - 8.0   Glucose, UA NEGATIVE NEGATIVE mg/dL   Hgb urine dipstick NEGATIVE NEGATIVE   Bilirubin Urine SMALL (A) NEGATIVE   Ketones, ur NEGATIVE NEGATIVE mg/dL   Protein, ur NEGATIVE NEGATIVE mg/dL   Nitrite NEGATIVE NEGATIVE   Leukocytes, UA NEGATIVE NEGATIVE  Culture, Urine     Status: None   Collection Time: 04/10/17  5:02 PM  Result Value Ref Range   Specimen Description URINE, CLEAN CATCH    Special Requests NONE    Culture NO GROWTH    Report Status 04/11/2017 FINAL   CBC     Status: Abnormal   Collection Time: 04/11/17  5:26 AM  Result Value Ref Range   WBC 11.1 (H) 4.0 - 10.5 K/uL   RBC 3.65 (L) 4.22 - 5.81 MIL/uL   Hemoglobin  11.8 (L) 13.0 - 17.0 g/dL   HCT 40.936.1 (L) 81.139.0 - 91.452.0 %   MCV 98.9 78.0 - 100.0 fL   MCH 32.3 26.0 - 34.0 pg   MCHC 32.7 30.0 - 36.0 g/dL   RDW 78.211.6 95.611.5 - 21.315.5 %   Platelets 308 150 - 400 K/uL    Radiology/Results: No results found.  Anti-infectives: Anti-infectives    Start     Dose/Rate Route Frequency Ordered Stop   04/08/17 1257  polymyxin B 500,000 Units, bacitracin 50,000 Units in sodium chloride irrigation 0.9 % 500 mL irrigation  Status:  Discontinued       As needed 04/08/17 1257 04/08/17 1348   04/08/17 1040  cefoTEtan in Dextrose 5% (CEFOTAN) 2-2.08 GM-% IVPB    Comments:  Lorenda IshiharaGibbs, Bonnie   : cabinet override      04/08/17 1040 04/08/17 2244   04/08/17 0600  cefoTEtan (CEFOTAN) 2 g in dextrose 5 % 50 mL IVPB  Status:  Discontinued     2 g 100 mL/hr over 30 Minutes Intravenous On call to O.R. 04/07/17 1556 04/08/17 1133      Assessment/Plan: Problem List: Patient Active Problem List   Diagnosis Date Noted  . Incarcerated right inguinal hernia 04/07/2017    Scrotal seroma with scrotal support on.  Patient not wanting to go  home because he believes his hernia has recurred.  Will get ultrasound of scrotum to assess.   4 Days Post-Op    LOS: 5 days   Matt B. Daphine Deutscher, MD, [facility] [phone] beeper [phone]  04/12/2017 8:46 AM

## 2017-04-12 NOTE — Progress Notes (Signed)
Pt and girlfriend have been arguing for the past hour. Pt is upset that Doctor DC most of his pain meds this am..  He has been walking the halls frequently without limitation. Pt. Pulled IV out and stated that he is "going to get the hell out of here."

## 2017-04-13 MED ORDER — POLYETHYLENE GLYCOL 3350 17 G PO PACK
17.0000 g | PACK | Freq: Every day | ORAL | Status: DC | PRN
  Administered 2017-04-13: 17 g via ORAL
  Filled 2017-04-13: qty 1

## 2017-04-13 MED ORDER — IBUPROFEN 400 MG PO TABS: 400.0000 mg | ORAL_TABLET | Freq: Four times a day (QID) | ORAL | 0 refills | Status: AC | PRN

## 2017-04-13 MED ORDER — OXYCODONE-ACETAMINOPHEN 5-325 MG PO TABS: 1.0000 | ORAL_TABLET | Freq: Four times a day (QID) | ORAL | 0 refills | Status: AC | PRN

## 2017-04-13 MED FILL — OXYCODONE W/APAP 5/325 TAB: 5-325 | 5 days supply | Qty: 20 | Fill #0

## 2017-04-13 NOTE — Discharge Summary (Signed)
[facility] Discharge Summary   Patient ID: Russell FellingKeith Artiaga MRN: [id number] DOB/AGE: 62/01/1956 62 y.o.  Admit date: 04/07/2017 Discharge date: 04/13/2017  Admitting Diagnosis: Incarcerated right inguinal hernia  Discharge Diagnosis Patient Active Problem List   Diagnosis Date Noted  . Incarcerated right inguinal hernia 04/07/2017    Consultants None  Imaging: Kore[place] Scrotum  Result Date: 04/12/2017 CLINICAL DATA:  Patient with right scrotal pain for 4 days. EXAM: SCROTAL ULTRASOUND DOPPLER ULTRASOUND OF THE TESTICLES TECHNIQUE: Complete ultrasound examination of the testicles, epididymis, and other scrotal structures was performed. Color and spectral Doppler ultrasound were also utilized to evaluate blood flow to the testicles. COMPARISON:  CT abdomen pelvis 04/07/2017 FINDINGS: Right testicle Measurements: 5.1 x 3.2 x 3.6 cm. No mass or microlithiasis visualized. Left testicle Measurements: 4.8 x 2.8 x 2.9 cm. No mass or microlithiasis visualized. Right epididymis: There is a 7 x 5 x 5 mm hypoechoic area which may represent epididymal head cyst versus spermatocele. Left epididymis:  Normal in size and appearance. Hydrocele:  Right greater than left hydroceles. Varicocele:  None visualized. There is a 5.4 x 8.5 x 4.8 cm complex cystic structure located medial to the right testicle. Pulsed Doppler interrogation of both testes demonstrates normal low resistance arterial and venous waveforms bilaterally. IMPRESSION: There is a large complex septated cystic structure located medial to the right testicle which is favored to represent postoperative change/hematoma/ seroma. Normal sonographic appearance of the testicles bilaterally. Bilateral hydroceles. Electronically Signed   By: Annia Beltrew  Davis M.D.   On: 04/12/2017 14:09   [place]ore[place] Art/ven Flow Abd Pelv Doppler  Result Date: 04/12/2017 CLINICAL DATA:  Patient with right scrotal pain for 4 days. EXAM: SCROTAL ULTRASOUND DOPPLER ULTRASOUND OF THE  TESTICLES TECHNIQUE: Complete ultrasound examination of the testicles, epididymis, and other scrotal structures was performed. Color and spectral Doppler ultrasound were also utilized to evaluate blood flow to the testicles. COMPARISON:  CT abdomen pelvis 04/07/2017 FINDINGS: Right testicle Measurements: 5.1 x 3.2 x 3.6 cm. No mass or microlithiasis visualized. Left testicle Measurements: 4.8 x 2.8 x 2.9 cm. No mass or microlithiasis visualized. Right epididymis: There is a 7 x 5 x 5 mm hypoechoic area which may represent epididymal head cyst versus spermatocele. Left epididymis:  Normal in size and appearance. Hydrocele:  Right greater than left hydroceles. Varicocele:  None visualized. There is a 5.4 x 8.5 x 4.8 cm complex cystic structure located medial to the right testicle. Pulsed Doppler interrogation of both testes demonstrates normal low resistance arterial and venous waveforms bilaterally. IMPRESSION: There is a large complex septated cystic structure located medial to the right testicle which is favored to represent postoperative change/hematoma/ seroma. Normal sonographic appearance of the testicles bilaterally. Bilateral hydroceles. Electronically Signed   By: Annia Beltrew  Davis M.D.   On: 04/12/2017 14:09    Procedures Dr. Andrey CampanileWilson (04/08/17) - Open repair of incarcerated right indirect inguinal hernia repair with mesh   Hospital Course:  Russell FellingKeith Lavey is a 61yo male who [facility] with acute onset of right groin pain.  Workup showed right inguinal hernia containing loops of small and large bowel without bowel compromise.  Patient was admitted and underwent procedure listed above.  Tolerated procedure well and was transferred to the floor.  Due to illicit drug abuse pain control was very difficult postoperatively. Diet was advanced as tolerated.  WBC trended down. U/s confirmed persistent scrotal swelling to be seroma/hematoma and there was no arterial or venous compromise. On POD5 the  patient was  voiding well, tolerating diet, ambulating well, pain well controlled, vital signs stable, incisions c/d/i and felt stable for discharge home.  Patient will follow up in our office in 2 weeks and knows to call with questions or concerns.  He will call to confirm appointment date/time.    I have personally reviewed the patients medication history on the [place] controlled substance database.    Physical Exam: Gen:  Alert, NAD, pleasant Pulm:  effort normal Abd: Soft, distended, +BS, right inguinal incision cdi, moderate swelling in scrotum Ext:  No erythema, edema, or tenderness BUE/BLE   Allergies as of 04/13/2017      Reactions   No Known Allergies       Medication List    TAKE these medications   ibuprofen 400 MG tablet Commonly known as:  ADVIL,MOTRIN Take 1 tablet (400 mg total) by mouth every 6 (six) hours as needed.   oxyCODONE-acetaminophen 5-325 MG tablet Commonly known as:  PERCOCET/ROXICET Take 1 tablet by mouth every 6 (six) hours as needed for moderate pain. What changed:  how much to take  when to take this  reasons to take this        Follow-up Information    Gaynelle Adu, MD. Call in 2 week(s).   Specialty:  General Surgery Why:  We are working on your appointment, please call to confirm. Contact information: [address] [place] [address] [phone]           Signed: Edson Snowball, [facility] 04/13/2017, 8:55 AM Pager: [phone] Consults: [phone] Mon-Fri 7:00 am-4:30 pm Sat-Sun 7:00 am-11:30 am

## 2017-04-13 NOTE — Progress Notes (Signed)
Pt was constipated informed on call Dr. Sheliah HatchKinsinger and ordered Miralax 17 gm daily PRN.

## 2017-04-13 NOTE — Progress Notes (Signed)
Patient discharged per order- patient given discharge instructions and prescriptions.  Patient had all questions answered.  Patient took out his IV when PA stated he had orders to be discharged.  Patient refused all meds except for Percocet.  Patient discharged by wheelchair with volunteer and significant other.

## 2017-08-04 ENCOUNTER — Encounter (HOSPITAL_COMMUNITY): Payer: Self-pay | Admitting: Neurology

## 2017-08-04 ENCOUNTER — Emergency Department (HOSPITAL_COMMUNITY)
Admission: EM | Admit: 2017-08-04 | Discharge: 2017-08-04 | Disposition: A | Discharge status: Home / Self Care | Payer: Self-pay | Attending: Emergency Medicine | Admitting: Emergency Medicine

## 2017-08-04 DIAGNOSIS — F17210 Nicotine dependence, cigarettes, uncomplicated: Secondary | ICD-10-CM | POA: Insufficient documentation

## 2017-08-04 DIAGNOSIS — R1031 Right lower quadrant pain: Secondary | ICD-10-CM | POA: Insufficient documentation

## 2017-08-04 LAB — URINALYSIS, ROUTINE W REFLEX MICROSCOPIC
BACTERIA UA: NONE SEEN
BILIRUBIN URINE: NEGATIVE
Glucose, UA: NEGATIVE mg/dL
KETONES UR: NEGATIVE mg/dL
LEUKOCYTES UA: NEGATIVE
NITRITE: NEGATIVE
Protein, ur: NEGATIVE mg/dL
SPECIFIC GRAVITY, URINE: 1.016 (ref 1.005–1.030)
pH: 5 (ref 5.0–8.0)

## 2017-08-04 LAB — COMPREHENSIVE METABOLIC PANEL
ALBUMIN: 3.7 g/dL (ref 3.5–5.0)
ALK PHOS: 91 U/L (ref 38–126)
ALT: 22 U/L (ref 17–63)
ANION GAP: 7 (ref 5–15)
AST: 29 U/L (ref 15–41)
BILIRUBIN TOTAL: 0.6 mg/dL (ref 0.3–1.2)
BUN: 15 mg/dL (ref 6–20)
CALCIUM: 8.8 mg/dL — AB (ref 8.9–10.3)
CO2: 24 mmol/L (ref 22–32)
Chloride: 104 mmol/L (ref 101–111)
Creatinine, Ser: 0.91 mg/dL (ref 0.61–1.24)
Glucose, Bld: 111 mg/dL — ABNORMAL HIGH (ref 65–99)
POTASSIUM: 3.6 mmol/L (ref 3.5–5.1)
Sodium: 135 mmol/L (ref 135–145)
TOTAL PROTEIN: 7.2 g/dL (ref 6.5–8.1)

## 2017-08-04 LAB — LIPASE, BLOOD: Lipase: 29 U/L (ref 11–51)

## 2017-08-04 LAB — CBC
HEMATOCRIT: 39.1 % (ref 39.0–52.0)
HEMOGLOBIN: 13.2 g/dL (ref 13.0–17.0)
MCH: 32.2 pg (ref 26.0–34.0)
MCHC: 33.8 g/dL (ref 30.0–36.0)
MCV: 95.4 fL (ref 78.0–100.0)
Platelets: 269 10*3/uL (ref 150–400)
RBC: 4.1 MIL/uL — ABNORMAL LOW (ref 4.22–5.81)
RDW: 12 % (ref 11.5–15.5)
WBC: 7.6 10*3/uL (ref 4.0–10.5)

## 2017-08-04 MED ORDER — DIAZEPAM 5 MG PO TABS
5.0000 mg | ORAL_TABLET | Freq: Once | ORAL | Status: AC
  Administered 2017-08-04: 5 mg via ORAL
  Filled 2017-08-04: qty 1

## 2017-08-04 MED ORDER — KETOROLAC TROMETHAMINE 15 MG/ML IJ SOLN
15.0000 mg | Freq: Once | INTRAMUSCULAR | Status: AC
  Administered 2017-08-04: 15 mg via INTRAMUSCULAR
  Filled 2017-08-04: qty 1

## 2017-08-04 MED ORDER — ACETAMINOPHEN 500 MG PO TABS
1000.0000 mg | ORAL_TABLET | Freq: Once | ORAL | Status: AC
  Administered 2017-08-04: 1000 mg via ORAL
  Filled 2017-08-04: qty 2

## 2017-08-04 MED ORDER — OXYCODONE-ACETAMINOPHEN 5-325 MG PO TABS
1.0000 | ORAL_TABLET | Freq: Once | ORAL | Status: AC
  Administered 2017-08-04: 1 via ORAL
  Filled 2017-08-04: qty 1

## 2017-08-04 NOTE — ED Notes (Signed)
Pt reports he is supposed to be in court or he will have a failure to appear and states he needs a note saying he was here today advised pt that MD would be in to speak with him shortly.

## 2017-08-04 NOTE — ED Notes (Signed)
Faxed requested paperwork to pt lawyer at pt request

## 2017-08-04 NOTE — ED Notes (Signed)
ED Provider at bedside. 
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
## 2017-08-04 NOTE — ED Triage Notes (Signed)
Pt reports RLQ abd pain onset this morning after picking up his girlfriend. Has hernia surgery several months ago. Isn't sure if he pulled something or if it is something else. Denies v/d, but feels nauseated. Is a x 4. Hasn't been able to f/u with surgeon who did repair.

## 2017-08-04 NOTE — Discharge Instructions (Signed)
Please follow up with your surgeon after the hernia repair. It would also be good to establish care with an outpatient primary care doctor who can help with management of your chronic medical conditions.  You can take over the counter ibuprofen as needed for pain.  If your pain gets worse or you develop swelling in your groin area or an outpouching in your abdomen/groin area, please return to the emergency room.

## 2018-03-14 IMAGING — CT CT ABD-PELV W/ CM
2 of 5 series · 15 of 46 positions shown, 17 images · IV contrast (iopamidol)
Comparison: None.

CLINICAL DATA: Right inguinal region pain and swelling

EXAM:
CT ABDOMEN AND PELVIS WITH CONTRAST
TECHNIQUE: Multidetector CT imaging of the abdomen and pelvis was performed
using the standard protocol following bolus administration of
intravenous contrast.
CONTRAST:  100mL 8PM8N3-622 IOPAMIDOL (8PM8N3-622) INJECTION 61%

[Series 3: abd/ pelvis 5.0 i30f 2 · axial · 0.77mm/px · z∈[+720,+1255]mm · 12 of 119 slices shown, 14 images]
[im 6/119  soft-tissue]
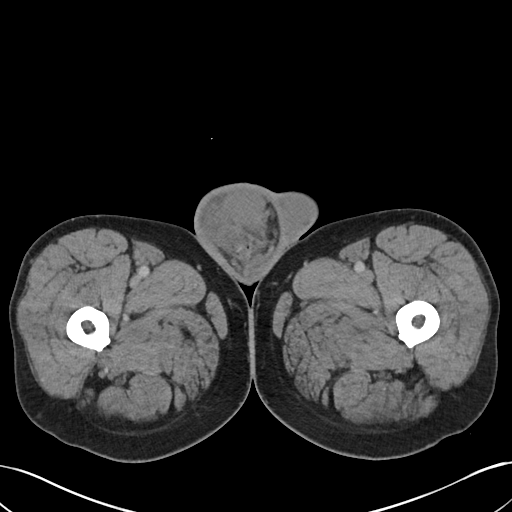
[im 6/119  bone]
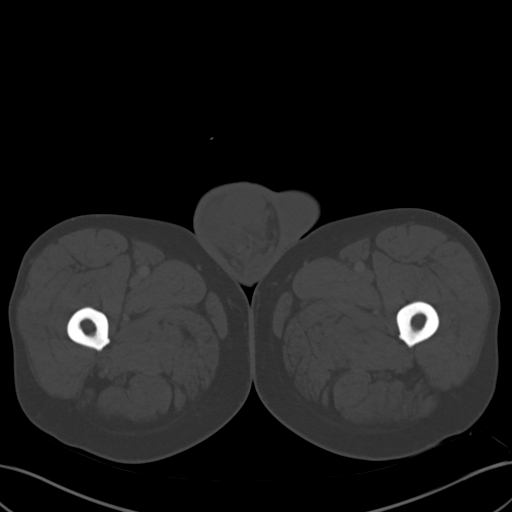
[im 18/119  soft-tissue]
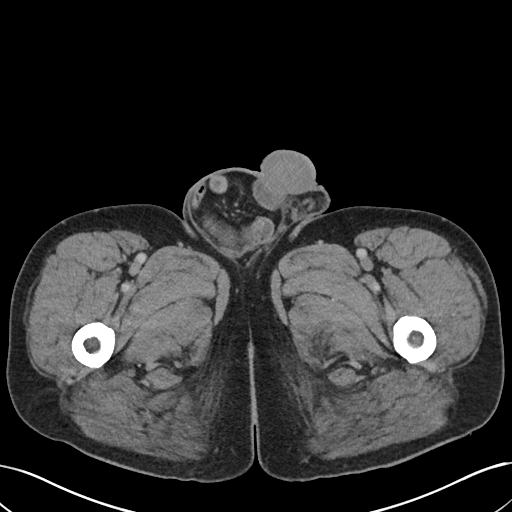
[im 24/119  soft-tissue]
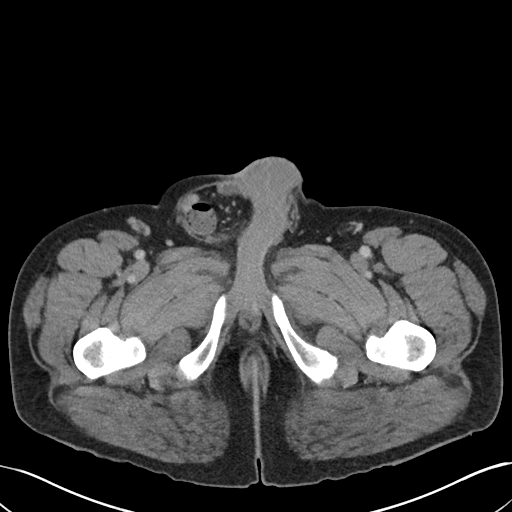
[im 36/119  soft-tissue]
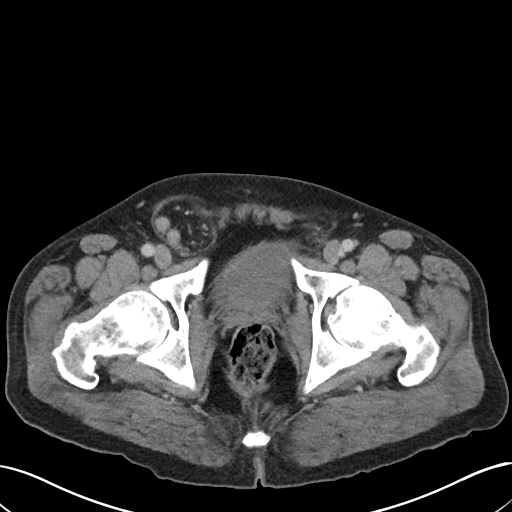
[im 48/119  soft-tissue]
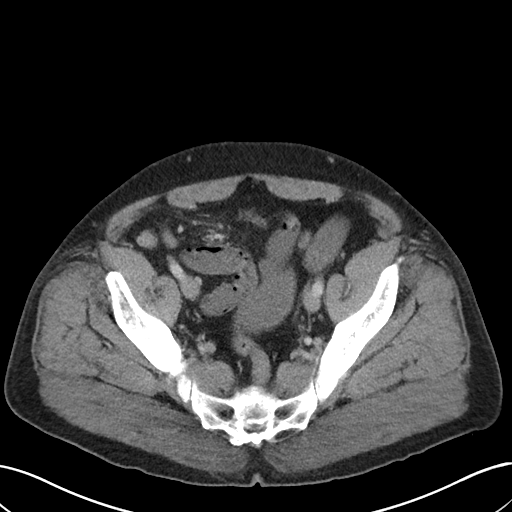
[im 54/119  soft-tissue]
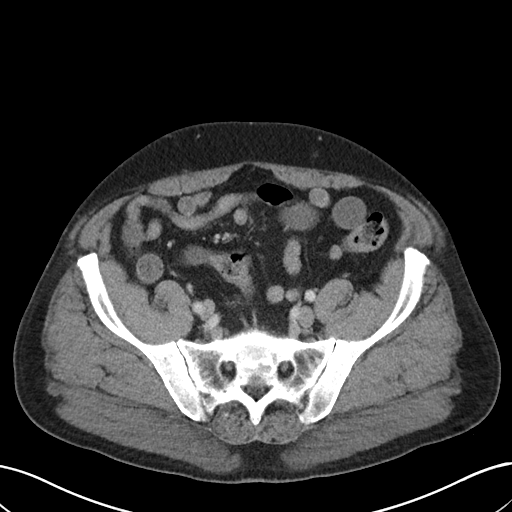
[im 65/119  soft-tissue]
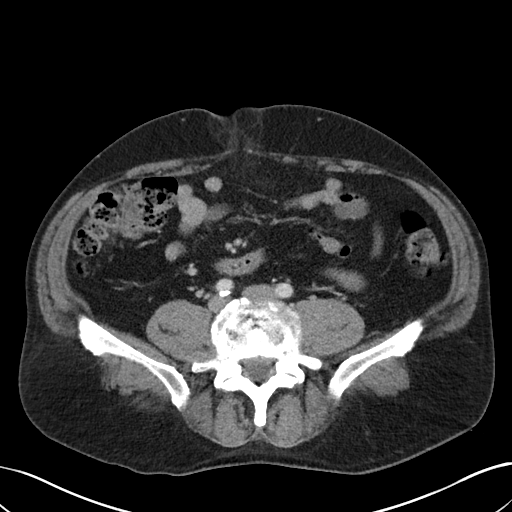
[im 71/119  soft-tissue]
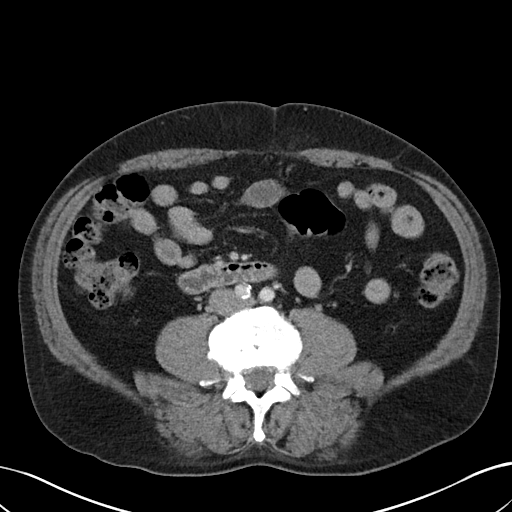
[im 83/119  soft-tissue]
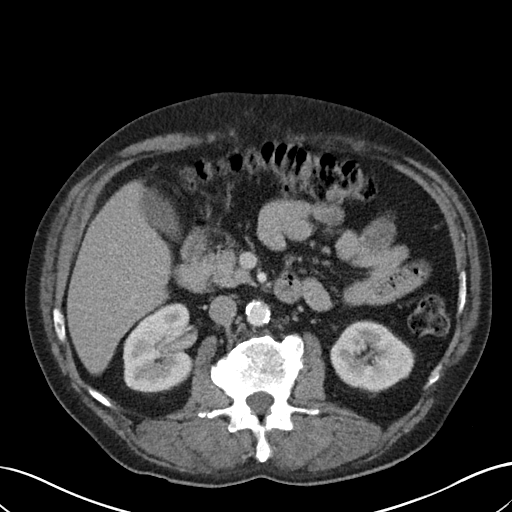
[im 83/119  bone]
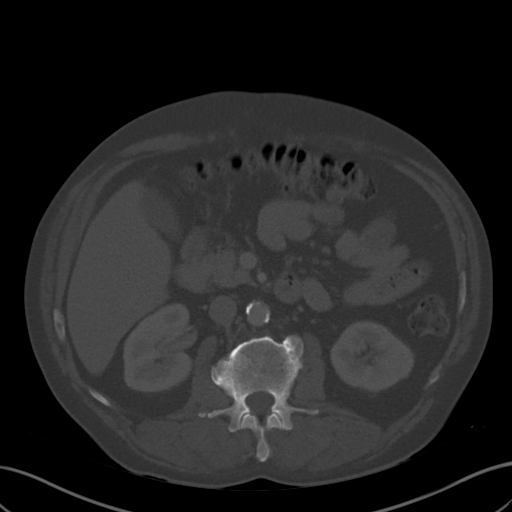
[im 95/119  soft-tissue]
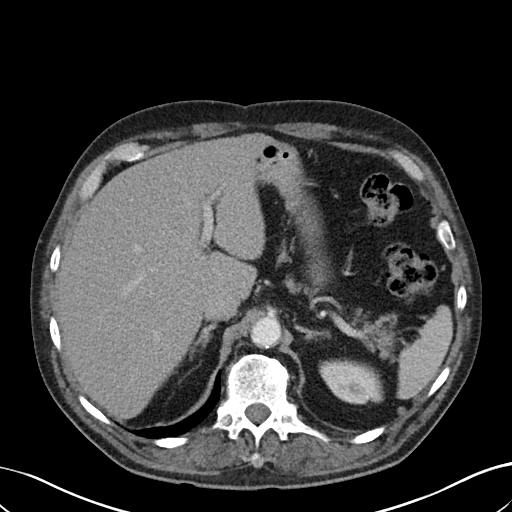
[im 101/119  soft-tissue]
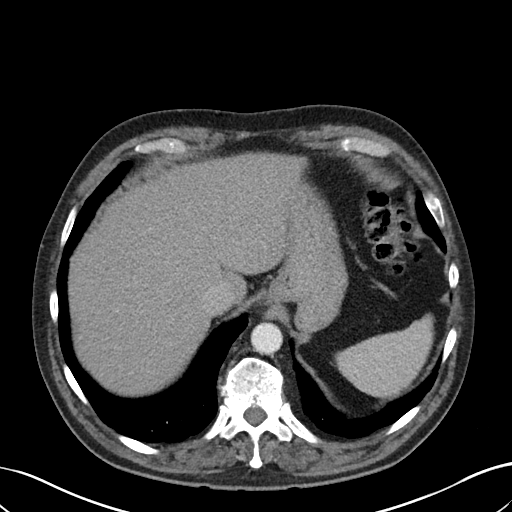
[im 113/119  soft-tissue]
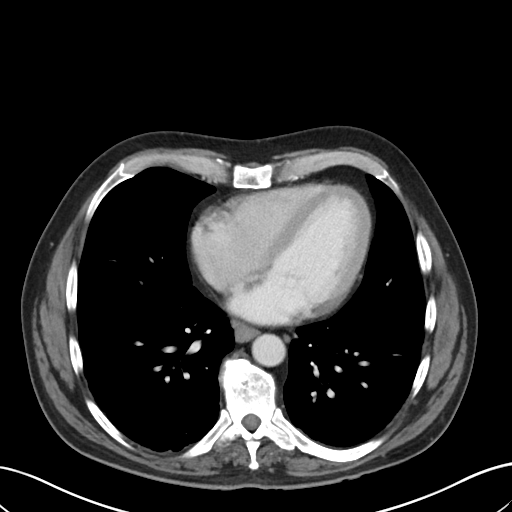

[Series 7: coronal soft tissue · coronal · 0.96mm/px · 3 of 104 slices shown]
[im 35/104  soft-tissue]
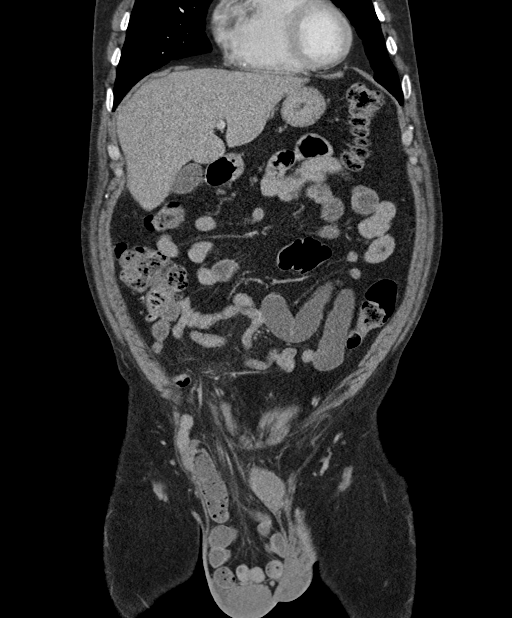
[im 46/104  soft-tissue]
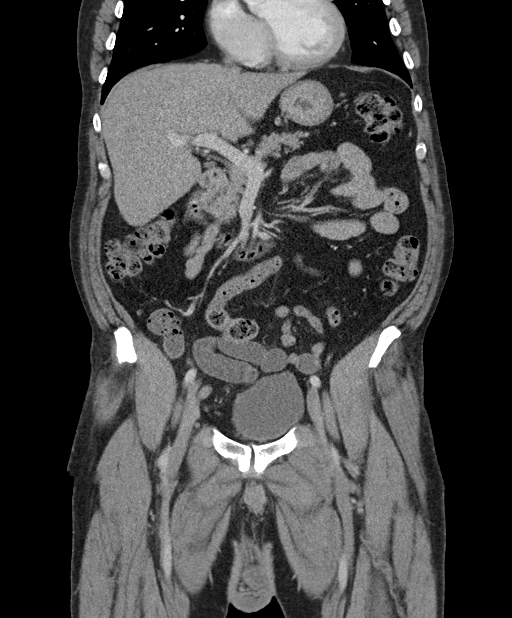
[im 58/104  soft-tissue]
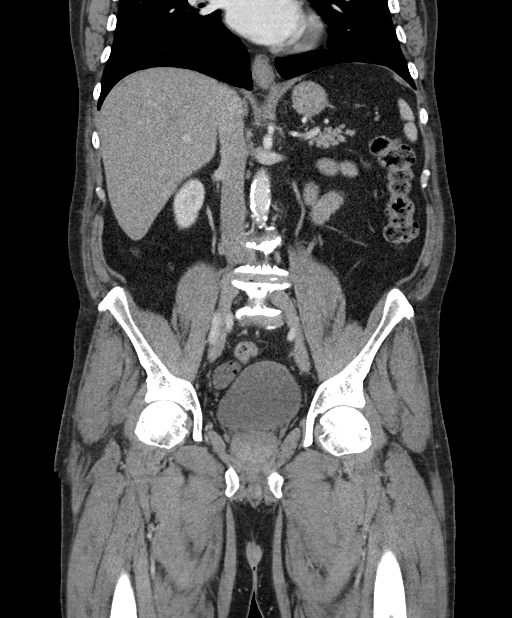

[15 of 46 positions shown; findings below may reference images not displayed]

FINDINGS: Lower chest: There is mild bibasilar atelectatic change. Lung bases
otherwise are clear. There is a small hiatal hernia.

Hepatobiliary: No focal liver lesions are evident. Gallbladder wall
is not appreciably thickened. There is no biliary duct dilatation.

Pancreas: No pancreatic mass or inflammatory focus.

Spleen: No splenic lesions are evident.

Adrenals/Urinary Tract: Adrenals appear normal bilaterally. There is
a 6 mm cyst in the lateral mid left kidney. There is no
hydronephrosis on either side. There is no renal or ureteral
calculus on either side. Urinary bladder is midline with wall
thickness within normal limits.

Stomach/Bowel: There is a sizable right inguinal hernia which
extends to the level of the scrotum on the right which contains
loops of small and large bowel. There is no bowel compromise. There
is moderate stool throughout the colon. There is no appreciable
bowel wall or mesenteric thickening. There is no bowel obstruction.
No free air or portal venous air evident.

Vascular/Lymphatic: There is atherosclerotic calcification in the
aorta and iliac arteries. There is no evident abdominal aortic
aneurysm. Major mesenteric vessels appear patent. There is no
evident adenopathy in the abdomen or pelvis.

Reproductive: Prostate and seminal vesicles are normal in size and
contour. There are several small prostatic calculi.

Other: As noted above, there is a sizable right inguinal hernia
which contains loops of small and large bowel but no bowel
compromise. This hernia extends to the level of the right scrotum.
Visualized portions of the right testis appear unremarkable by CT.
There is a moderate left inguinal hernia containing only fat. There
is a focal ventral hernia which likewise contains only fat located
at the level of the umbilicus. A second ventral hernia midline
contains only fat, located approximately 9 cm proximal to the
umbilicus.

Appendix appears normal. No abscess or ascites is evident in the
abdomen or pelvis.

Musculoskeletal: There is degenerative change throughout the lumbar
spine. There are no blastic or lytic bone lesions. There are no
intramuscular lesions evident.
IMPRESSION: Right inguinal hernia containing loops of small and large bowel
without bowel compromise. Hernia extends to the level of the right
scrotum.

Left inguinal hernia containing only fat. There also midline ventral
hernias containing only fat at the level of the umbilicus as well as
approximately 9 cm proximal to the umbilicus. There is a small
hiatal hernia as well.

Appendix appears normal. No bowel obstruction or bowel wall
thickening. No abscess.

No renal or ureteral calculus.  No hydronephrosis.

There is aortoiliac atherosclerosis. There is multifocal
degenerative change in the lumbar spine.
